# Patient Record
Sex: Male | Born: 1945 | Race: White | Hispanic: No | Marital: Married | Smoking: Never smoker
Health system: Southern US, Community
[De-identification: ages and names within clinical notes are randomized; demographics above are authoritative.]

## PROBLEM LIST (undated history)

## (undated) DIAGNOSIS — J309 Allergic rhinitis, unspecified: Secondary | ICD-10-CM

## (undated) DIAGNOSIS — E785 Hyperlipidemia, unspecified: Secondary | ICD-10-CM

## (undated) DIAGNOSIS — I739 Peripheral vascular disease, unspecified: Secondary | ICD-10-CM

## (undated) DIAGNOSIS — Z86010 Personal history of colonic polyps: Secondary | ICD-10-CM

## (undated) DIAGNOSIS — I868 Varicose veins of other specified sites: Secondary | ICD-10-CM

## (undated) DIAGNOSIS — K5730 Diverticulosis of large intestine without perforation or abscess without bleeding: Secondary | ICD-10-CM

## (undated) DIAGNOSIS — N400 Enlarged prostate without lower urinary tract symptoms: Secondary | ICD-10-CM

## (undated) DIAGNOSIS — Z5189 Encounter for other specified aftercare: Secondary | ICD-10-CM

## (undated) DIAGNOSIS — T7840XA Allergy, unspecified, initial encounter: Secondary | ICD-10-CM

## (undated) DIAGNOSIS — E039 Hypothyroidism, unspecified: Secondary | ICD-10-CM

## (undated) DIAGNOSIS — M19019 Primary osteoarthritis, unspecified shoulder: Secondary | ICD-10-CM

## (undated) HISTORY — PX: AXILLARY LYMPH NODE DISSECTION: SHX5229

## (undated) HISTORY — DX: Benign prostatic hyperplasia without lower urinary tract symptoms: N40.0

## (undated) HISTORY — DX: Hyperlipidemia, unspecified: E78.5

## (undated) HISTORY — PX: TRANSFORAMINAL LUMBAR INTERBODY FUSION (TLIF) WITH PEDICLE SCREW FIXATION 1 LEVEL: SHX6141

## (undated) HISTORY — DX: Encounter for other specified aftercare: Z51.89

## (undated) HISTORY — DX: Diverticulosis of large intestine without perforation or abscess without bleeding: K57.30

## (undated) HISTORY — DX: Allergy, unspecified, initial encounter: T78.40XA

## (undated) HISTORY — DX: Personal history of colonic polyps: Z86.010

## (undated) HISTORY — DX: Hypothyroidism, unspecified: E03.9

## (undated) HISTORY — DX: Primary osteoarthritis, unspecified shoulder: M19.019

## (undated) HISTORY — DX: Varicose veins of other specified sites: I86.8

## (undated) HISTORY — PX: VASECTOMY: SHX75

## (undated) HISTORY — PX: COLONOSCOPY: SHX174

## (undated) HISTORY — PX: VARICOSE VEIN SURGERY: SHX832

## (undated) HISTORY — DX: Allergic rhinitis, unspecified: J30.9

---

## 2004-12-12 ENCOUNTER — Ambulatory Visit: Payer: Self-pay | Admitting: Internal Medicine

## 2004-12-18 ENCOUNTER — Ambulatory Visit: Payer: Self-pay | Admitting: Internal Medicine

## 2005-01-17 ENCOUNTER — Ambulatory Visit: Payer: Self-pay | Admitting: Internal Medicine

## 2005-06-28 ENCOUNTER — Ambulatory Visit: Payer: Self-pay | Admitting: Internal Medicine

## 2005-07-11 ENCOUNTER — Ambulatory Visit: Payer: Self-pay | Admitting: Internal Medicine

## 2006-05-12 ENCOUNTER — Ambulatory Visit: Payer: Self-pay | Admitting: Internal Medicine

## 2006-05-16 ENCOUNTER — Ambulatory Visit: Payer: Self-pay | Admitting: Internal Medicine

## 2008-06-30 ENCOUNTER — Ambulatory Visit: Payer: Self-pay | Admitting: Internal Medicine

## 2008-06-30 LAB — CONVERTED CEMR LAB
Albumin: 4.2 g/dL (ref 3.5–5.2)
Alkaline Phosphatase: 51 units/L (ref 39–117)
BUN: 17 mg/dL (ref 6–23)
Bilirubin Urine: NEGATIVE
Calcium: 9 mg/dL (ref 8.4–10.5)
Direct LDL: 130.2 mg/dL
Eosinophils Absolute: 0.1 10*3/uL (ref 0.0–0.7)
Eosinophils Relative: 3 % (ref 0.0–5.0)
GFR calc Af Amer: 111 mL/min
GFR calc non Af Amer: 91 mL/min
Glucose, Bld: 102 mg/dL — ABNORMAL HIGH (ref 70–99)
HCT: 46.3 % (ref 39.0–52.0)
HDL: 31.7 mg/dL — ABNORMAL LOW (ref >39.0)
Hemoglobin, Urine: NEGATIVE
Hemoglobin: 16 g/dL (ref 13.0–17.0)
MCV: 92.4 fL (ref 78.0–100.0)
Monocytes Absolute: 0.5 10*3/uL (ref 0.1–1.0)
Monocytes Relative: 10.9 % (ref 3.0–12.0)
Neutro Abs: 3.2 10*3/uL (ref 1.4–7.7)
Nitrite: NEGATIVE
PSA: 0.46 ng/mL (ref 0.10–4.00)
Platelets: 168 10*3/uL (ref 150–400)
Potassium: 4.2 meq/L (ref 3.5–5.1)
RDW: 12.3 % (ref 11.5–14.6)
TSH: 5.74 microintl units/mL — ABNORMAL HIGH (ref 0.35–5.50)
Total Protein, Urine: NEGATIVE mg/dL
Total Protein: 6.7 g/dL (ref 6.0–8.3)
Triglycerides: 101 mg/dL (ref 0–149)
Urine Glucose: NEGATIVE mg/dL
WBC: 5 10*3/uL (ref 4.5–10.5)
pH: 5.5 (ref 5.0–8.0)

## 2008-07-06 ENCOUNTER — Encounter (INDEPENDENT_AMBULATORY_CARE_PROVIDER_SITE_OTHER): Payer: Self-pay | Admitting: *Deleted

## 2008-07-06 ENCOUNTER — Ambulatory Visit: Payer: Self-pay | Admitting: Internal Medicine

## 2008-07-06 DIAGNOSIS — Z860100 Personal history of colon polyps, unspecified: Secondary | ICD-10-CM | POA: Insufficient documentation

## 2008-07-06 DIAGNOSIS — J309 Allergic rhinitis, unspecified: Secondary | ICD-10-CM

## 2008-07-06 DIAGNOSIS — E785 Hyperlipidemia, unspecified: Secondary | ICD-10-CM | POA: Insufficient documentation

## 2008-07-06 DIAGNOSIS — K5730 Diverticulosis of large intestine without perforation or abscess without bleeding: Secondary | ICD-10-CM

## 2008-07-06 DIAGNOSIS — M19019 Primary osteoarthritis, unspecified shoulder: Secondary | ICD-10-CM | POA: Insufficient documentation

## 2008-07-06 DIAGNOSIS — E039 Hypothyroidism, unspecified: Secondary | ICD-10-CM

## 2008-07-06 DIAGNOSIS — N400 Enlarged prostate without lower urinary tract symptoms: Secondary | ICD-10-CM

## 2008-07-06 HISTORY — DX: Diverticulosis of large intestine without perforation or abscess without bleeding: K57.30

## 2008-07-06 HISTORY — DX: Primary osteoarthritis, unspecified shoulder: M19.019

## 2008-07-06 HISTORY — DX: Benign prostatic hyperplasia without lower urinary tract symptoms: N40.0

## 2008-07-06 HISTORY — DX: Personal history of colonic polyps: Z86.010

## 2008-07-06 HISTORY — DX: Hyperlipidemia, unspecified: E78.5

## 2008-07-06 HISTORY — DX: Hypothyroidism, unspecified: E03.9

## 2008-07-06 HISTORY — DX: Allergic rhinitis, unspecified: J30.9

## 2008-07-06 HISTORY — DX: Personal history of colon polyps, unspecified: Z86.0100

## 2008-08-08 ENCOUNTER — Telehealth (INDEPENDENT_AMBULATORY_CARE_PROVIDER_SITE_OTHER): Payer: Self-pay | Admitting: *Deleted

## 2008-10-21 HISTORY — PX: OTHER SURGICAL HISTORY: SHX169

## 2008-10-28 ENCOUNTER — Ambulatory Visit (HOSPITAL_COMMUNITY): Admission: RE | Admit: 2008-10-28 | Discharge: 2008-10-29 | Payer: Self-pay | Admitting: Orthopedic Surgery

## 2008-12-21 ENCOUNTER — Telehealth (INDEPENDENT_AMBULATORY_CARE_PROVIDER_SITE_OTHER): Payer: Self-pay | Admitting: *Deleted

## 2008-12-21 DIAGNOSIS — I868 Varicose veins of other specified sites: Secondary | ICD-10-CM

## 2008-12-21 HISTORY — DX: Varicose veins of other specified sites: I86.8

## 2009-08-01 ENCOUNTER — Encounter: Payer: Self-pay | Admitting: Internal Medicine

## 2009-09-25 ENCOUNTER — Ambulatory Visit: Payer: Self-pay | Admitting: Internal Medicine

## 2009-09-25 LAB — CONVERTED CEMR LAB
ALT: 23 units/L (ref 0–53)
BUN: 17 mg/dL (ref 6–23)
Bilirubin Urine: NEGATIVE
CO2: 30 meq/L (ref 19–32)
Chloride: 106 meq/L (ref 96–112)
Cholesterol: 213 mg/dL — ABNORMAL HIGH (ref 0–200)
Creatinine, Ser: 0.9 mg/dL (ref 0.4–1.5)
Eosinophils Absolute: 0.2 10*3/uL (ref 0.0–0.7)
Eosinophils Relative: 3.8 % (ref 0.0–5.0)
Glucose, Bld: 99 mg/dL (ref 70–99)
HCT: 46.7 % (ref 39.0–52.0)
Hemoglobin, Urine: NEGATIVE
Leukocytes, UA: NEGATIVE
Lymphs Abs: 1.1 10*3/uL (ref 0.7–4.0)
MCHC: 34 g/dL (ref 30.0–36.0)
MCV: 93.6 fL (ref 78.0–100.0)
Monocytes Absolute: 0.5 10*3/uL (ref 0.1–1.0)
Neutrophils Relative %: 66.5 % (ref 43.0–77.0)
Nitrite: NEGATIVE
PSA: 0.48 ng/mL (ref 0.10–4.00)
Platelets: 172 10*3/uL (ref 150.0–400.0)
Potassium: 4.7 meq/L (ref 3.5–5.1)
TSH: 4.26 microintl units/mL (ref 0.35–5.50)
Total Bilirubin: 0.9 mg/dL (ref 0.3–1.2)
Total Protein: 7 g/dL (ref 6.0–8.3)
WBC: 5.5 10*3/uL (ref 4.5–10.5)
pH: 5.5 (ref 5.0–8.0)

## 2009-10-02 ENCOUNTER — Ambulatory Visit: Payer: Self-pay | Admitting: Internal Medicine

## 2009-11-28 ENCOUNTER — Ambulatory Visit: Payer: Self-pay | Admitting: Internal Medicine

## 2009-11-28 LAB — CONVERTED CEMR LAB
ALT: 22 units/L (ref 0–53)
Cholesterol: 149 mg/dL (ref 0–200)
Total Protein: 6.8 g/dL (ref 6.0–8.3)
Triglycerides: 44 mg/dL (ref 0.0–149.0)

## 2010-01-28 IMAGING — CR DG SHOULDER 1V*R*
1 series · 1 of 1 positions shown · non-contrast
Comparison: None

CLINICAL DATA: right shoulder total arthroplasty

RIGHT SHOULDER - 1 VIEW

[view not recorded]
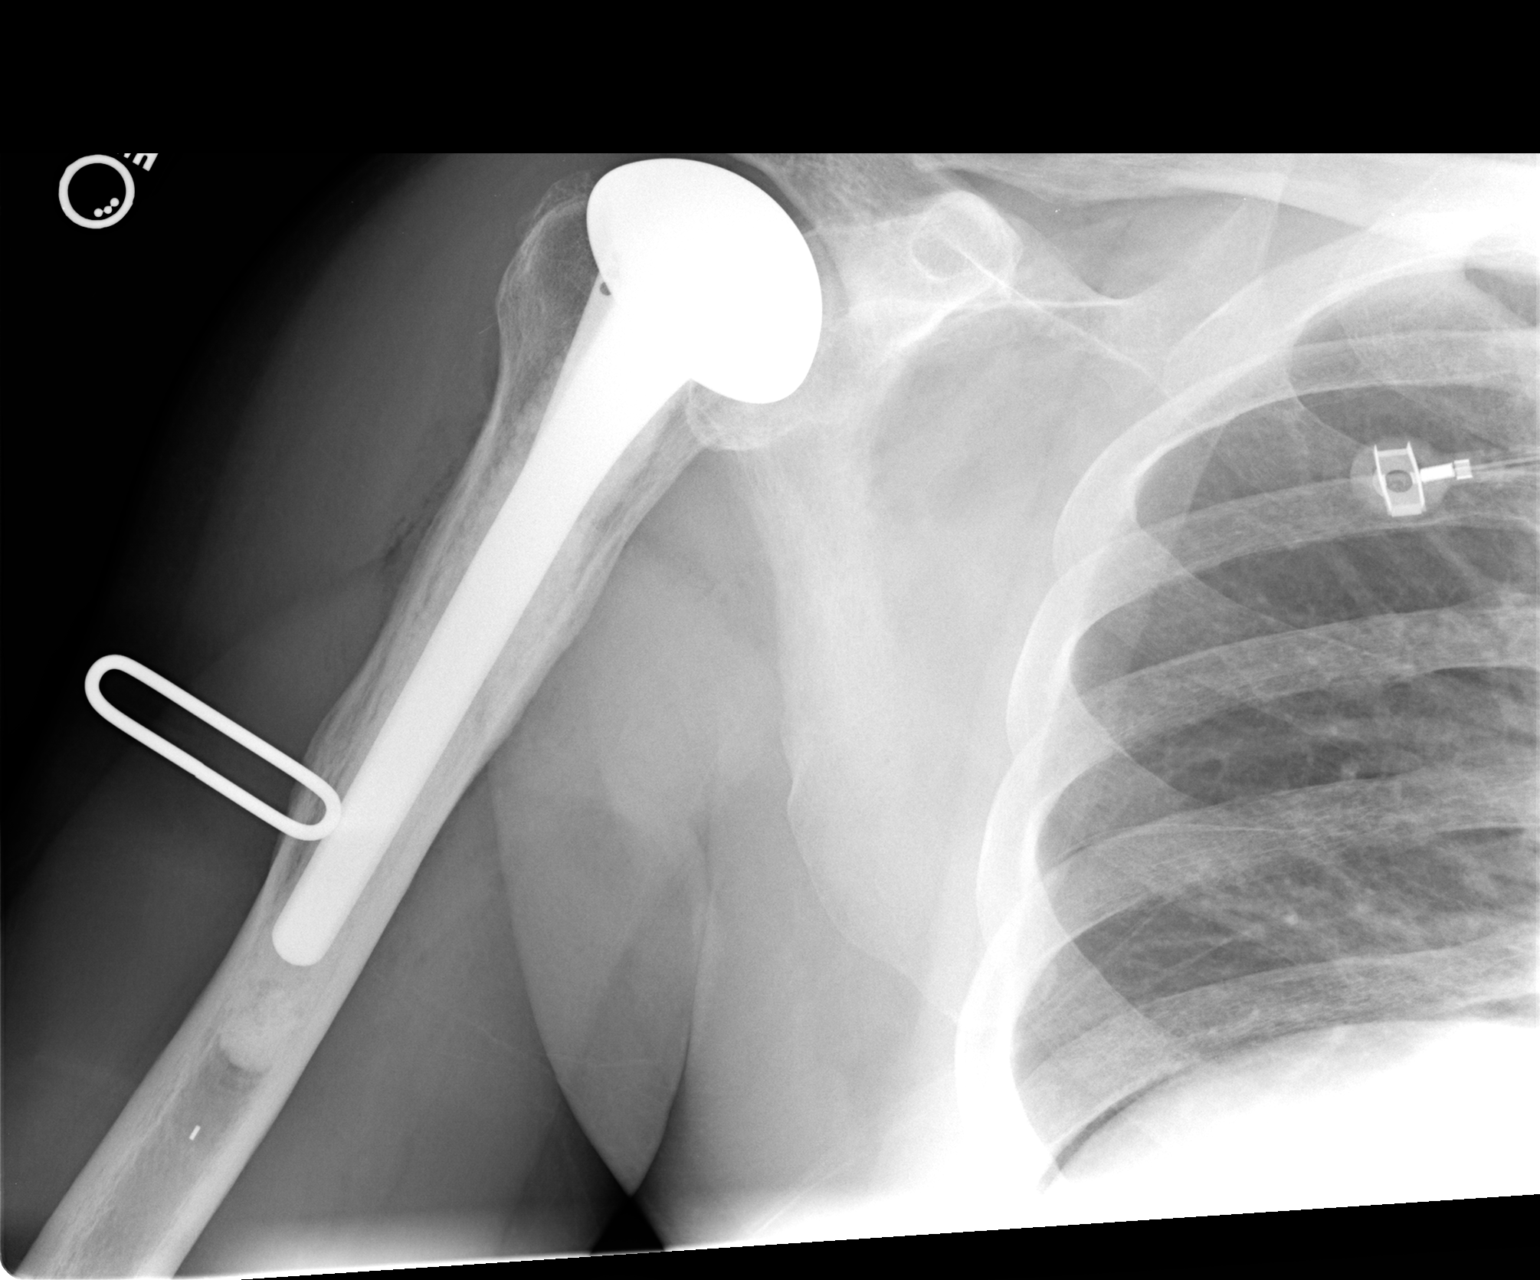

[1 of 1 positions shown; findings below may reference images not displayed]

FINDINGS: A single portable view provided.  There is a right
proximal humeral prosthesis in anatomic alignment.
IMPRESSION: Right humeral prosthesis in anatomic alignment.

## 2010-05-14 ENCOUNTER — Encounter: Payer: Self-pay | Admitting: Internal Medicine

## 2010-06-05 ENCOUNTER — Encounter (INDEPENDENT_AMBULATORY_CARE_PROVIDER_SITE_OTHER): Payer: Self-pay | Admitting: *Deleted

## 2010-07-04 ENCOUNTER — Encounter (INDEPENDENT_AMBULATORY_CARE_PROVIDER_SITE_OTHER): Payer: Self-pay | Admitting: *Deleted

## 2010-07-05 ENCOUNTER — Ambulatory Visit: Payer: Self-pay | Admitting: Internal Medicine

## 2010-07-18 ENCOUNTER — Ambulatory Visit: Payer: Self-pay | Admitting: Internal Medicine

## 2010-07-24 ENCOUNTER — Encounter: Payer: Self-pay | Admitting: Internal Medicine

## 2010-11-20 NOTE — Procedures (Signed)
Summary: Colonoscopy  Patient: [name] Note: All result statuses are Final unless otherwise noted.  Tests: (1) Colonoscopy (COL)   COL Colonoscopy           DONE     [facility]     [address]     [place], [place]  [address]           COLONOSCOPY PROCEDURE REPORT           PATIENT:  Kevin Stein, Kevin Stein  MR#:  [id number]     BIRTHDATE:  1945-11-06, 64 yrs. old  GENDER:  male     ENDOSCOPIST:  Wilhemina Bonito. Eda Keys, MD     REF. BY:  Surveillance Program Recall     PROCEDURE DATE:  07/18/2010     PROCEDURE:  Colonoscopy with snare polypectomy x 1     ASA CLASS:  Class II     INDICATIONS:  history of pre-cancerous (adenomatous) colon polyps,     surveillance and high-risk screening ; INDEX 2003 w/ TA; f/u 2006           MEDICATIONS:   Fentanyl 100 mcg IV, Versed 10 mg IV, Benadryl 12.5     mg IV           DESCRIPTION OF PROCEDURE:   After the risks benefits and     alternatives of the procedure were thoroughly explained, informed     consent was obtained.  Digital rectal exam was performed and     revealed no abnormalities.   The LB CF-[id number] endoscope     was introduced through the anus and advanced to the cecum, which     was identified by both the appendix and ileocecal valve, without     limitations.Time to cecum = 7:13 min.  The quality of the prep was     excellent, using MoviPrep.  The instrument was then slowly     withdrawn (time = 12:01 min) as the colon was fully examined.     <<PROCEDUREIMAGES>>           FINDINGS:  A diminutive polyp was found in the ascending colon.     Polyp was snared without cautery. Retrieval was successful.     Moderate diverticulosis was found in the sigmoid colon.     Retroflexed views in the rectum revealed no abnormalities.    The     scope was then withdrawn from the patient and the procedure     completed.           COMPLICATIONS:  None           ENDOSCOPIC IMPRESSION:     1) Diminutive polyp in the ascending  colon - removed     2) Moderate diverticulosis in the sigmoid colon           RECOMMENDATIONS:     1) Follow up colonoscopy in 5 years           ______________________________     Wilhemina Bonito. Eda Keys, MD           CC:  Corwin Levins, MD; The Patient           n.     eSIGNED:   Wilhemina Bonito. Eda Keys at 07/18/2010 11:35 AM           Pierre Bali, [id number]  Note: An exclamation mark (!) indicates a result that was not dispersed into the flowsheet. Document Creation Date: 07/18/2010 11:36 AM _______________________________________________________________________  (  1) Order result status: Final Collection or observation date-time: 07/18/2010 11:28 Requested date-time:  Receipt date-time:  Reported date-time:  Referring Physician:   Ordering Physician: Fransico Setters [phone]) Specimen Source:  Source: Launa Grill Order Number: [id number] Lab site:   Appended Document: Colonoscopy     Procedures Next Due Date:    Colonoscopy: 06/2015

## 2010-11-20 NOTE — Letter (Signed)
Summary: Colonoscopy Letter  [facility]  [address] [place], [place] [address]   Phone: [phone]  Fax: [phone]      May 14, 2010 MRN: [id number]   Kevin Stein [address] [place], [place]  [address]   Dear Mr. COMMONS,   According to your medical record, it is time for you to schedule a Colonoscopy. The American Cancer Society recommends this procedure as a method to detect early colon cancer. Patients with a family history of colon cancer, or a personal history of colon polyps or inflammatory bowel disease are at increased risk.  This letter has been generated based on the recommendations made at the time of your procedure. If you feel that in your particular situation this may no longer apply, please contact our office.  Please call our office at [phone] to schedule this appointment or to update your records at your earliest convenience.  Thank you for cooperating with [place] to provide you with the very best care possible.   Sincerely,  Wilhemina Bonito. Marina Goodell, M.D.  [facility] Gastroenterology Division [phone]

## 2010-11-20 NOTE — Letter (Signed)
Summary: Patient Notice- Polyp Results  [facility]  [address] [place], [place] [address]   Phone: [phone]  Fax: [phone]        July 24, 2010 MRN: [id number]    Kevin Stein [address] [place], [place]  [address]    Dear Kevin Stein,  I am pleased to inform you that the colon polyp(s) removed during your recent colonoscopy was (were) found to be benign (no cancer detected) upon pathologic examination.  I recommend you have a repeat colonoscopy examination in 5 years to look for recurrent polyps, as having colon polyps increases your risk for having recurrent polyps or even colon cancer in the future.  Should you develop new or worsening symptoms of abdominal pain, bowel habit changes or bleeding from the rectum or bowels, please schedule an evaluation with either your primary care physician or with me.  Additional information/recommendations:  __ No further action with gastroenterology is needed at this time. Please      follow-up with your primary care physician for your other healthcare      needs.  _ Please call us if you are having persistent problems or have questions about your condition that have not been fully answered at this time.  Sincerely,  Hilarie Fredrickson MD  This letter has been electronically signed by your physician.  Appended Document: Patient Notice- Polyp Results letter mailed

## 2010-11-20 NOTE — Letter (Signed)
Summary: Previsit letter  [facility]  [address] [place], [place] [address]   Phone: [phone]  Fax: [phone]       06/05/2010 MRN: [id number]  [facility] [address] [place], [place]  [address]  Dear Kevin Stein,  Welcome to the Gastroenterology Division at [facility].    You are scheduled to see a nurse for your pre-procedure visit on 07-05-10 at 8:30a.m. on the 3rd floor at [facility], [address]  We ask that you try to arrive at our office 15 minutes prior to your appointment time to allow for check-in.  Your nurse visit will consist of discussing your medical and surgical history, your immediate family medical history, and your medications.    Please bring a complete list of all your medications or, if you prefer, bring the medication bottles and we will list them.  We will need to be aware of both prescribed and over the counter drugs.  We will need to know exact dosage information as well.  If you are on blood thinners (Coumadin, Plavix, Aggrenox, Ticlid, etc.) please call our office today/prior to your appointment, as we need to consult with your physician about holding your medication.   Please be prepared to read and sign documents such as consent forms, a financial agreement, and acknowledgement forms.  If necessary, and with your consent, a friend or relative is welcome to sit-in on the nurse visit with you.  Please bring your insurance card so that we may make a copy of it.  If your insurance requires a referral to see a specialist, please bring your referral form from your primary care physician.  No co-pay is required for this nurse visit.     If you cannot keep your appointment, please call [phone] to cancel or reschedule prior to your appointment date.  This allows [place] the opportunity to schedule an appointment for another patient in need of care.    Thank you for choosing [facility] for your medical  needs.  We appreciate the opportunity to care for you.  Please visit [place] at our website  to learn more about our practice.                     Sincerely.                                                                                                                   The Gastroenterology Division

## 2010-11-20 NOTE — Miscellaneous (Signed)
Summary: recall colon--ch.  Clinical Lists Changes  Medications: Added new medication of MOVIPREP 100 GM  SOLR (PEG-KCL-NACL-NASULF-NA ASC-C) As directed - Signed Rx of MOVIPREP 100 GM  SOLR (PEG-KCL-NACL-NASULF-NA ASC-C) As directed;  #1 x 0;  Signed;  Entered by: Clide Cliff RN;  Authorized by: Hilarie Fredrickson MD;  Method used: [facility] [address] [phone]*, [address], [place], [place], [place]  [address], Ph: [phone] or [phone], Fax: [phone] Observations: Added new observation of ALLERGY REV: Done (07/05/2010 8:12)    Prescriptions: MOVIPREP 100 GM  SOLR (PEG-KCL-NACL-NASULF-NA ASC-C) As directed  #1 x 0   Entered by:   Clide Cliff RN   Authorized by:   Hilarie Fredrickson MD   Signed by:   Clide Cliff RN on 07/05/2010   Method used:   [facility]  [address] [phone]* (retail)       [address]       [place], [place]  [address]       Ph: [phone] or [phone]       Fax: [phone]   RxID:   [id number]

## 2010-11-20 NOTE — Letter (Signed)
Summary: [facility] Instructions  [facility]  [address] [place], [place] [address]   Phone: [phone]  Fax: [phone]       JEARLD HEMP    Mar 11, 1946    MRN: [id number]        Procedure Day Dorna Bloom:  [facility]  07/18/10     Arrival Time:  9:00AM      Procedure Time:  10:00AM     Location of Procedure:                    _ X_  [facility] (4th Floor)                       PREPARATION FOR COLONOSCOPY WITH MOVIPREP   Starting 5 days prior to your procedure 07/13/10 do not eat nuts, seeds, popcorn, corn, beans, peas,  salads, or any raw vegetables.  Do not take any fiber supplements (e.g. Metamucil, Citrucel, and Benefiber).  THE DAY BEFORE YOUR PROCEDURE         DATE: 07/17/10  DAY: TUESDAY  1.  Drink clear liquids the entire day-NO SOLID FOOD  2.  Do not drink anything colored red or purple.  Avoid juices with pulp.  No orange juice.  3.  Drink at least 64 oz. (8 glasses) of fluid/clear liquids during the day to prevent dehydration and help the prep work efficiently.  CLEAR LIQUIDS INCLUDE: Water Jello Ice Popsicles Tea (sugar ok, no milk/cream) Powdered fruit flavored drinks Coffee (sugar ok, no milk/cream) Gatorade Juice: apple, white grape, white cranberry  Lemonade Clear bullion, consomm, broth Carbonated beverages (any kind) Strained chicken noodle soup Hard Candy                             4.  In the morning, mix first dose of MoviPrep solution:    Empty 1 Pouch A and 1 Pouch B into the disposable container    Add lukewarm drinking water to the top line of the container. Mix to dissolve    Refrigerate (mixed solution should be used within 24 hrs)  5.  Begin drinking the prep at 5:00 p.m. The MoviPrep container is divided by 4 marks.   Every 15 minutes drink the solution down to the next mark (approximately 8 oz) until the full liter is complete.   6.  Follow completed prep with 16 oz of clear liquid of your choice  (Nothing red or purple).  Continue to drink clear liquids until bedtime.  7.  Before going to bed, mix second dose of MoviPrep solution:    Empty 1 Pouch A and 1 Pouch B into the disposable container    Add lukewarm drinking water to the top line of the container. Mix to dissolve    Refrigerate  THE DAY OF YOUR PROCEDURE      DATE: 07/18/10  DAY: WEDNESDAY  Beginning at 5:00AM (5 hours before procedure):         1. Every 15 minutes, drink the solution down to the next mark (approx 8 oz) until the full liter is complete.  2. Follow completed prep with 16 oz. of clear liquid of your choice.    3. You may drink clear liquids until 8:00AM (2 HOURS BEFORE PROCEDURE).   MEDICATION INSTRUCTIONS  Unless otherwise instructed, you should take regular prescription medications with a small sip of water   as early as possible the morning  of your procedure.         OTHER INSTRUCTIONS  You will need a responsible adult at least 63 years of age to accompany you and drive you home.   This person must remain in the waiting room during your procedure.  Wear loose fitting clothing that is easily removed.  Leave jewelry and other valuables at home.  However, you may wish to bring a book to read or  an iPod/MP3 player to listen to music as you wait for your procedure to start.  Remove all body piercing jewelry and leave at home.  Total time from sign-in until discharge is approximately 2-3 hours.  You should go home directly after your procedure and rest.  You can resume normal activities the  day after your procedure.  The day of your procedure you should not:   Drive   Make legal decisions   Operate machinery   Drink alcohol   Return to work  You will receive specific instructions about eating, activities and medications before you leave.    The above instructions have been reviewed and explained to me by   Clide Cliff, RN______________________    I fully understand  and can verbalize these instructions _____________________________ Date _________

## 2010-12-20 ENCOUNTER — Other Ambulatory Visit: Payer: Managed Care, Other (non HMO)

## 2010-12-20 ENCOUNTER — Other Ambulatory Visit: Payer: Self-pay | Admitting: Internal Medicine

## 2010-12-20 ENCOUNTER — Encounter (INDEPENDENT_AMBULATORY_CARE_PROVIDER_SITE_OTHER): Payer: Self-pay | Admitting: *Deleted

## 2010-12-20 DIAGNOSIS — Z0000 Encounter for general adult medical examination without abnormal findings: Secondary | ICD-10-CM

## 2010-12-20 DIAGNOSIS — Z1289 Encounter for screening for malignant neoplasm of other sites: Secondary | ICD-10-CM

## 2010-12-20 LAB — URINALYSIS
Ketones, ur: NEGATIVE
Specific Gravity, Urine: 1.025 (ref 1.000–1.030)
Urine Glucose: NEGATIVE
Urobilinogen, UA: 0.2 (ref 0.0–1.0)
pH: 6 (ref 5.0–8.0)

## 2010-12-20 LAB — HEPATIC FUNCTION PANEL
Bilirubin, Direct: 0 mg/dL (ref 0.0–0.3)
Total Bilirubin: 0.6 mg/dL (ref 0.3–1.2)
Total Protein: 6.2 g/dL (ref 6.0–8.3)

## 2010-12-20 LAB — CBC WITH DIFFERENTIAL/PLATELET
Basophils Relative: 0.3 % (ref 0.0–3.0)
Eosinophils Absolute: 0.1 10*3/uL (ref 0.0–0.7)
MCHC: 33.6 g/dL (ref 30.0–36.0)
MCV: 93.1 fl (ref 78.0–100.0)
Monocytes Absolute: 0.5 10*3/uL (ref 0.1–1.0)
Neutrophils Relative %: 68.1 % (ref 43.0–77.0)
Platelets: 172 10*3/uL (ref 150.0–400.0)
RDW: 13 % (ref 11.5–14.6)

## 2010-12-20 LAB — BASIC METABOLIC PANEL
BUN: 19 mg/dL (ref 6–23)
CO2: 28 mEq/L (ref 19–32)
Chloride: 105 mEq/L (ref 96–112)
Creatinine, Ser: 0.8 mg/dL (ref 0.4–1.5)
Glucose, Bld: 95 mg/dL (ref 70–99)

## 2010-12-20 LAB — LIPID PANEL
Cholesterol: 164 mg/dL (ref 0–200)
LDL Cholesterol: 102 mg/dL — ABNORMAL HIGH (ref 0–99)
Triglycerides: 106 mg/dL (ref 0.0–149.0)

## 2010-12-21 ENCOUNTER — Encounter: Payer: Self-pay | Admitting: Internal Medicine

## 2010-12-21 ENCOUNTER — Encounter (INDEPENDENT_AMBULATORY_CARE_PROVIDER_SITE_OTHER): Payer: Managed Care, Other (non HMO) | Admitting: Internal Medicine

## 2010-12-21 DIAGNOSIS — Z0000 Encounter for general adult medical examination without abnormal findings: Secondary | ICD-10-CM

## 2010-12-27 NOTE — Assessment & Plan Note (Signed)
Summary: PHYSICAL / NWS  #   Vital Signs:  Patient profile:   65 year old male Height:      72 inches Weight:      223 pounds BMI:     30.35 O2 Sat:      95 % on Room air Temp:     98 degrees F oral Pulse rate:   69 / minute BP sitting:   138 / 82  (left arm) Cuff size:   large  Vitals Entered By: Zella Ball Ewing CMA (AAMA) (December 21, 2010 10:27 AM)  O2 Flow:  Room air  CC: Adult Physical/RE   CC:  Adult Physical/RE.  History of Present Illness: here for wellness and f/u;  overall doin g ok except for ongoing fatigue and recurrent sleep disruption with recurring right shoulder pain, and occas nocturia;  overall o/w doing ok;  Pt denies CP, worsening sob, doe, wheezing, orthopnea, pnd, worsening LE edema, palps, dizziness or syncope  Pt denies new neuro symptoms such as headache, facial or extremity weakness  Pt denies polydipsia, polyuria Overall good compliance with meds, trying to follow low chol diet, wt stable, and tries to walk the dogs 3 mile per day on the wkends, plans to start doing more during the week.  Has some fatigue but no daytime somnolene.  No fever, wt loss, night sweats, loss of appetite or other constitutional symptoms  Overall good compliance with meds, and good tolerability.  Denies worsening depressive symptoms, suicidal ideation, or panic.   Pt states good ability with ADL's, low fall risk, home safety reviewed and adequate, no significant change in hearing or vision, trying to follow lower chol diet, and occasionally active only with regular excercise.   Preventive Screening-Counseling & Management      Drug Use:  no.    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 Take 1 By Mouth Qd 2)  Levothyroxine Sodium 25 Mcg Tabs (Levothyroxine Sodium) .Marland Kitchen.. 1 By Mouth Once Daily 3)  Adult Aspirin Ec Low Strength 81 Mg Tbec (Aspirin) .Marland Kitchen.. 1 By Mouth Once Daily 4)  Fish Oil 1200 Mg Caps (Omega-3 Fatty Acids) .Marland Kitchen.. 1 By Mouth Once Daily 5)  Vitamin B-12 2500 Mcg Subl (Cyanocobalamin) .Marland Kitchen.. 1 By Mouth Once Daily 6)  Pravachol 40 Mg Tabs (Pravastatin Sodium) .Marland Kitchen.. 1po Once Daily  Current Medications (verified): 1)  Multivitamins  Tabs (Multiple Vitamin) .... Take 1 By Mouth Qd 2)  Levothyroxine Sodium 25 Mcg Tabs (Levothyroxine Sodium) .Marland Kitchen.. 1 By Mouth Once Daily 3)  Adult Aspirin Ec Low Strength 81 Mg Tbec (Aspirin) .Marland Kitchen.. 1 By Mouth Once Daily 4)  Fish Oil 1200 Mg Caps (Omega-3 Fatty Acids) .Marland Kitchen.. 1 By Mouth Once Daily 5)  Vitamin B-12 2500 Mcg Subl (Cyanocobalamin) .Marland Kitchen.. 1 By Mouth Once Daily 6)  Pravachol 40 Mg Tabs (Pravastatin Sodium) .Marland Kitchen.. 1po Once Daily 7)  Glucosamine-Chondroitin  Caps (Glucosamine-Chondroit-Vit C-Mn) .Marland Kitchen.. 1 By Mouth Once Daily 8)  Flax Seed Oil 1000 Mg Caps (Flaxseed (Linseed)) .Marland Kitchen.. 1 By Mouth Once Daily 9)  Tramadol Hcl 100 Mg Xr24h-Tab (Tramadol Hcl) .Marland KitchenMarland KitchenMarland Kitchen  1 By Mouth At Bedtime As Needed Pain  Allergies (verified): No Known Drug Allergies  Past History:  Past Medical History: Last updated: 07/06/2008 Hyperlipidemia Benign prostatic hypertrophy Diverticulosis, colon Colonic polyps, hx of - adenomatous 2003 Hypothyroidism Allergic rhinitis  Past Surgical History: Last updated: 10/02/2009 Vasectomy s/p left middle ear surgury s/p right shoulder surgury in jan 2010 - dr Ranell Patrick  Family History: Last updated: 07/06/2008 HTN DM testicular cancer  Social History: Last updated: 12/21/2010 Never Smoked Alcohol use-yes work - [employer]or[employer] Married 2 children Drug use-no  Risk  Factors: Smoking Status: never (07/06/2008)  Social History: Never Smoked Alcohol use-yes work - Surveyor, [employer] and [employer] Married 2 children Drug use-no Drug Use:  no  Review of Systems  The patient denies anorexia, fever, vision loss, decreased hearing, hoarseness, chest pain, syncope, dyspnea on exertion, peripheral edema, prolonged cough, headaches, hemoptysis, abdominal pain, melena, hematochezia, severe indigestion/heartburn, hematuria, muscle weakness, suspicious skin lesions, transient blindness, difficulty walking, depression, unusual weight change, abnormal bleeding, enlarged lymph nodes, and angioedema.         all otherwise negative per pt -    Physical Exam  General:  alert and overweight-appearing.   Head:  normocephalic and atraumatic.   Eyes:  vision grossly intact, pupils equal, and pupils round.   Ears:  R ear normal and L ear normal.   Nose:  no external deformity and no nasal discharge.   Mouth:  no gingival abnormalities and pharynx pink and moist.   Neck:  supple and no masses.   Lungs:  normal respiratory effort and normal breath sounds.   Heart:  normal rate and regular rhythm.   Abdomen:  soft, non-tender, and normal bowel sounds.   Msk:  no joint tenderness and no joint swelling. , right shoudler with decreased ROM Extremities:  no edema, no erythema  Neurologic:  cranial nerves II-XII intact and strength normal in all extremities.   Skin:  color normal and no rashes.   Psych:  not depressed appearing and slightly anxious.     Impression & Recommendations:  Problem # 1:  Preventive Health Care (ICD-V70.0) Overall doing well, age appropriate education and counseling updated, referral for preventive services and immunizations addressed, dietary counseling and smoking status adressed , most recent labs reviewed, ecg reviewed I have personally reviewed and have noted 1.The patient's medical and social history 2.Their use of alcohol, tobacco or illicit  drugs 3.Their current medications and supplements 4. Functional ability including ADL's, fall risk, home safety risk, hearing & visual impairment  5.Diet and physical activities 6.Evidence for depression or mood disorders The patients weight, height, BMI  have been recorded in the chart I have made referrals, counseling and provided education to the patient based review of the above  Orders: EKG w/ Interpretation ([address])  Problem # 2:  DEGENERATIVE JOINT DISEASE, RIGHT SHOULDER (ICD-715.91)  His updated medication list for this problem includes:    Adult Aspirin Ec Low Strength 81 Mg Tbec (Aspirin) .Marland Kitchen... 1 by mouth once daily    Tramadol Hcl 100 Mg Xr24h-tab (Tramadol hcl) .Marland Kitchen... 1 by mouth at bedtime as needed pain pt wants to avoid nsaid ;  for tramadol Er at bedtime as needed;  Discussed use of medications, application of heat or cold, and exercises.   Complete Medication List: 1)  Multivitamins Tabs (Multiple vitamin) .... Take 1 by mouth qd 2)  Levothyroxine Sodium 25 Mcg Tabs (Levothyroxine sodium) .Marland Kitchen.. 1 by mouth once daily 3)  Adult Aspirin  Ec Low Strength 81 Mg Tbec (Aspirin) .Marland Kitchen.. 1 by mouth once daily 4)  Fish Oil 1200 Mg Caps (Omega-3 fatty acids) .Marland Kitchen.. 1 by mouth once daily 5)  Vitamin B-12 2500 Mcg Subl (Cyanocobalamin) .Marland Kitchen.. 1 by mouth once daily 6)  Pravachol 40 Mg Tabs (Pravastatin sodium) .Marland Kitchen.. 1po once daily 7)  Glucosamine-chondroitin Caps (Glucosamine-chondroit-vit c-mn) .Marland Kitchen.. 1 by mouth once daily 8)  Flax Seed Oil 1000 Mg Caps (Flaxseed (linseed)) .Marland Kitchen.. 1 by mouth once daily 9)  Tramadol Hcl 100 Mg Xr24h-tab (Tramadol hcl) .Marland Kitchen.. 1 by mouth at bedtime as needed pain  Patient Instructions: 1)  Please take all new medications as prescribed - the pain medication at night 2)  Continue all previous medications as before this visit  3)  Please call if you feel you need the referral back to Dr Ranell Patrick 4)  Please schedule a follow-up appointment in 1 year, or sooner if  needed Prescriptions: LEVOTHYROXINE SODIUM 25 MCG TABS (LEVOTHYROXINE SODIUM) 1 by mouth once daily  #90 x 3   Entered and Authorized by:   Corwin Levins MD   Signed by:   Corwin Levins MD on 12/21/2010   Method used:   [facility]        [facility]  [address] [phone]* (retail)       [address]       [place], [place]  [address]       Ph: [phone] or [phone]       Fax: [phone]   RxID:   [id number] PRAVACHOL 40 MG TABS (PRAVASTATIN SODIUM) 1po once daily  #90 x 3   Entered and Authorized by:   Corwin Levins MD   Signed by:   Corwin Levins MD on 12/21/2010   Method used:   [facility]ically to        [facility] [address] [phone]* (retail)       [address]       [place], [place]  [address]       Ph: [phone] or [phone]       Fax: [phone]   RxID:   [id number] TRAMADOL HCL 100 MG XR24H-TAB (TRAMADOL HCL) 1 by mouth at bedtime as needed pain  #30 x 5   Entered and Authorized by:   Corwin Levins MD   Signed by:   Corwin Levins MD on 12/21/2010   Method used:   [facility]ronically to        [facility] [address] [phone]* (retail)       [address]       [place], [place]  [address]       Ph: [phone] or [phone]       Fax: [phone]   RxID:   [id number]    Orders Added: 1)  EKG w/ Interpretation [[address]] 2)  Est. Patient 40-64 years [phone]

## 2011-02-04 LAB — URINALYSIS, ROUTINE W REFLEX MICROSCOPIC
Bilirubin Urine: NEGATIVE
Hgb urine dipstick: NEGATIVE
Nitrite: NEGATIVE
Specific Gravity, Urine: 1.026 (ref 1.005–1.030)
pH: 7 (ref 5.0–8.0)

## 2011-02-04 LAB — TYPE AND SCREEN
ABO/RH(D): A POS
Antibody Screen: NEGATIVE

## 2011-02-04 LAB — CBC
HCT: 47.8 % (ref 39.0–52.0)
Hemoglobin: 13.6 g/dL (ref 13.0–17.0)
Hemoglobin: 16.1 g/dL (ref 13.0–17.0)
MCHC: 33.7 g/dL (ref 30.0–36.0)
MCV: 91.3 fL (ref 78.0–100.0)
RBC: 5.24 MIL/uL (ref 4.22–5.81)
RDW: 12.8 % (ref 11.5–15.5)

## 2011-02-04 LAB — BASIC METABOLIC PANEL
CO2: 25 mEq/L (ref 19–32)
Calcium: 8.4 mg/dL (ref 8.4–10.5)
Chloride: 105 mEq/L (ref 96–112)
Creatinine, Ser: 0.85 mg/dL (ref 0.4–1.5)
GFR calc Af Amer: >60 mL/min (ref >60)
GFR calc Af Amer: >60 mL/min (ref >60)
GFR calc non Af Amer: >60 mL/min (ref >60)
Glucose, Bld: 109 mg/dL — ABNORMAL HIGH (ref 70–99)
Potassium: 4.5 mEq/L (ref 3.5–5.1)
Sodium: 136 mEq/L (ref 135–145)
Sodium: 139 mEq/L (ref 135–145)

## 2011-02-04 LAB — DIFFERENTIAL
Basophils Relative: 0 % (ref 0–1)
Eosinophils Absolute: 0.1 10*3/uL (ref 0.0–0.7)
Monocytes Absolute: 0.4 10*3/uL (ref 0.1–1.0)
Monocytes Relative: 8 % (ref 3–12)

## 2011-03-05 NOTE — Op Note (Signed)
NAME:  Kevin Stein, Kevin Stein           ACCOUNT NO.:  [id number]   MEDICAL RECORD NO.:  [id number]          PATIENT TYPE:  OIB   LOCATION:  5021                         FACILITY:  [facility]   PHYSICIAN:  Almedia Balls. Ranell Patrick, M.D. DATE OF BIRTH:  24-Mar-1946   DATE OF PROCEDURE:  10/28/2008  DATE OF DISCHARGE:                               OPERATIVE REPORT   PREOPERATIVE DIAGNOSIS:  Right shoulder end-stage osteoarthritis.   POSTOPERATIVE DIAGNOSIS:  Right shoulder end-stage osteoarthritis.   PROCEDURE PERFORMED:  Right shoulder hemiarthroplasty using DePuy [employer]  Advantage System.   ATTENDING SURGEON:  Almedia Balls. Ranell Patrick, MD   ASSISTANT:  Donnie Coffin. Dixon, PA-C   General anesthesia was used plus interscalene block.   ESTIMATED BLOOD LOSS:  200 mL.   FLUID REPLACEMENT:  1200 mL of crystalloid.   URINE OUTPUT:  Not recorded.   INSTRUMENT COUNT:  Correct.   COMPLICATIONS:  None.   Preoperative antibiotics were given.   INDICATIONS:  The patient is a 65 year old male with worsening arthritis  in the right shoulder and pain as well as loss of function.  The patient  has failed an extensive period of conservative management and desires  operative treatment to restore function and eliminate pain.  Informed  consent was obtained.   DESCRIPTION OF THE PROCEDURE:  After an adequate level of anesthesia was  achieved, the patient was positioned in modified beach chair position.  The right shoulder was sterilely prepped and draped in the usual  fashion.  The patient's external rotation with elbow to the side is  about 20 degrees.  We performed a deltopectoral approach starting at the  coracoid process extending down towards the anterior humeral shaft,  dissection down through the subcutaneous tissue using Bovie, identified  the cephalic vein taken laterally with the deltoid, pectoralis was taken  medially, upper 0.5 cm peg was released off the humerus.  Conjoint  tendon was taken medially.  The  subscapularis was identified and taken  off the bicipital groove using a needle tip Bovie.  We then divided the  subscapularis free from the underlying capsule, and then placed #2  FiberWires in the modified W stitch into the free end of the  subscapularis.  Next, we went ahead and released the soft tissue off the  humerus laterally and progressively externally rotated.  We then went  ahead and made a humeral cut using the humeral neck resection guide with  the elbow at the patient's side and the forearm externally rotated about  10-15 degrees.  Once this cut was made, we went ahead and removed excess  osteophytes off the posteroinferior humerus.  We then went ahead and  checked the glenoid condition.  The glenoid cartilage was in good shape.  The labrum was intact, but was seemed to be somewhat torn.  We went and  left that alone for stability.  We did go ahead and tenotomized the  biceps and marked that at the appropriate length.  Next, we went ahead  and sequentially reamed up to a size 12 stem.  We actually tried for a  14 and pretty much got the reamer  down, but when we broached, we were  only able to broach up to the size 12.  Thus we decided we are going to  cement a 12 and our version was perfect at about 10-15 degrees of  retroversion  on our cut.  With the stem in place, selected our humeral  size for maximum coverage.  We used a 52 x 21 eccentric and dialed that  posterior superiorly, reduced the shoulder and we were happy with soft  tissue balancing and coverage.  I was able to translate the humerus 50%  of the posterior diameter of the glenoid and 50% inferiorly.  We then  removed the trial implants.  We thoroughly pulse irrigated, placed the  cement restricter distal and the cement implant into place using a DePuy  1 cement.  Once the cement was allowed to harden, we again checked  posterior aspect of shoulder for any loose bone or osteophytes, none  were encountered.  We  placed two #2 FiberWire sutures in the shaft prior  to cementing the stem.  We then repaired the subscap anatomically with  #2 FiberWire both in the rotator interval as well as the W stitch  sutures through bone and through tendon.  We had an anatomic repair of  the subscapularis.  It was not under significant tension until about 30  degrees of external rotation, and the patient's shoulder moved very well  passively.  We had impacted the real 52 x 21 prior to closure.  At this  point, we closed the deltopectoral interval with 0 Vicryl suture bearing  the cephalic vein and then we went ahead and closed subcutaneous tissues  with 2-0 Vicryl followed by 4-0 Monocryl for skin.  Sterile dressing was  applied.  The patient tolerated the surgery well.      Almedia Balls. Ranell Patrick, M.D.  Electronically Signed     SRN/MEDQ  D:  10/28/2008  T:  10/29/2008  Job:  [id number]

## 2011-05-27 ENCOUNTER — Other Ambulatory Visit: Payer: Self-pay

## 2011-05-27 MED ORDER — LEVOTHYROXINE SODIUM 25 MCG PO TABS: 25.0000 ug | ORAL_TABLET | Freq: Every day | ORAL | Status: DC

## 2011-05-27 MED ORDER — PRAVASTATIN SODIUM 40 MG PO TABS: 40.0000 mg | ORAL_TABLET | Freq: Every evening | ORAL | Status: DC

## 2012-03-23 ENCOUNTER — Telehealth: Payer: Self-pay

## 2012-03-23 DIAGNOSIS — Z0000 Encounter for general adult medical examination without abnormal findings: Secondary | ICD-10-CM

## 2012-03-23 NOTE — Telephone Encounter (Signed)
Put order in for physical labs. 

## 2012-06-02 ENCOUNTER — Other Ambulatory Visit (INDEPENDENT_AMBULATORY_CARE_PROVIDER_SITE_OTHER): Payer: Managed Care, Other (non HMO)

## 2012-06-02 DIAGNOSIS — Z0000 Encounter for general adult medical examination without abnormal findings: Secondary | ICD-10-CM

## 2012-06-02 LAB — CBC WITH DIFFERENTIAL/PLATELET
Basophils Relative: 0.4 % (ref 0.0–3.0)
Eosinophils Absolute: 0.2 10*3/uL (ref 0.0–0.7)
Hemoglobin: 15.3 g/dL (ref 13.0–17.0)
Lymphocytes Relative: 22.9 % (ref 12.0–46.0)
MCHC: 32.6 g/dL (ref 30.0–36.0)
Monocytes Relative: 11.2 % (ref 3.0–12.0)
Neutro Abs: 3.4 10*3/uL (ref 1.4–7.7)
RBC: 5.03 Mil/uL (ref 4.22–5.81)

## 2012-06-02 LAB — BASIC METABOLIC PANEL
BUN: 22 mg/dL (ref 6–23)
CO2: 27 mEq/L (ref 19–32)
Chloride: 104 mEq/L (ref 96–112)
Creatinine, Ser: 0.8 mg/dL (ref 0.4–1.5)

## 2012-06-02 LAB — URINALYSIS, ROUTINE W REFLEX MICROSCOPIC
Bilirubin Urine: NEGATIVE
Hgb urine dipstick: NEGATIVE
Leukocytes, UA: NEGATIVE
Nitrite: NEGATIVE

## 2012-06-02 LAB — HEPATIC FUNCTION PANEL
ALT: 21 U/L (ref 0–53)
Albumin: 4.2 g/dL (ref 3.5–5.2)
Total Protein: 6.6 g/dL (ref 6.0–8.3)

## 2012-06-02 LAB — LIPID PANEL
LDL Cholesterol: 109 mg/dL — ABNORMAL HIGH (ref 0–99)
Total CHOL/HDL Ratio: 4

## 2012-06-02 LAB — TSH: TSH: 4.55 u[IU]/mL (ref 0.35–5.50)

## 2012-06-09 ENCOUNTER — Encounter: Payer: Self-pay | Admitting: Internal Medicine

## 2012-06-09 ENCOUNTER — Ambulatory Visit (INDEPENDENT_AMBULATORY_CARE_PROVIDER_SITE_OTHER): Payer: Managed Care, Other (non HMO) | Admitting: Internal Medicine

## 2012-06-09 VITALS — BP 142/70 | HR 76 | Temp 97.4°F | Ht 72.0 in | Wt 219.2 lb

## 2012-06-09 DIAGNOSIS — Z0000 Encounter for general adult medical examination without abnormal findings: Secondary | ICD-10-CM | POA: Insufficient documentation

## 2012-06-09 MED ORDER — LEVOTHYROXINE SODIUM 25 MCG PO TABS: 25.0000 ug | ORAL_TABLET | Freq: Every day | ORAL | Status: DC

## 2012-06-09 MED ORDER — PRAVASTATIN SODIUM 40 MG PO TABS: 40.0000 mg | ORAL_TABLET | Freq: Every evening | ORAL | Status: DC

## 2012-06-09 NOTE — Patient Instructions (Addendum)
Continue all other medications as before Your refills were done today as requested Please call if you would like the referral to urology Please continue your efforts at being more active, low cholesterol diet, and weight control. Please return in 1 year for your yearly visit, or sooner if needed, with Lab testing done 3-5 days before

## 2012-06-09 NOTE — Progress Notes (Signed)
Subjective:    Patient ID: Kevin Stein, male    DOB: 06-19-1946, 66 y.o.   MRN: [id number]  HPI  Here for wellness and f/u;  Overall doing ok;  Pt denies CP, worsening SOB, DOE, wheezing, orthopnea, PND, worsening LE edema, palpitations, dizziness or syncope.  Pt denies neurological change such as new Headache, facial or extremity weakness.  Pt denies polydipsia, polyuria, or low sugar symptoms. Pt states overall good compliance with treatment and medications, good tolerability, and trying to follow lower cholesterol diet.  Pt denies worsening depressive symptoms, suicidal ideation or panic. No fever, wt loss, night sweats, loss of appetite, or other constitutional symptoms.  Pt states good ability with ADL's, low fall risk, home safety reviewed and adequate, no significant changes in hearing or vision, and occasionally active with exercise.  Does have sense of ongoing fatigue, but denies signficant hypersomnolence, though his wife does.  Does have to get up 1-3 times per night, depending on fluids he's had during the day which disrupts sleep. Past Medical History  Diagnosis Date  . HYPOTHYROIDISM 07/06/2008    Qualifier: Diagnosis of  By: Jonny Ruiz MD, Len Blalock   . HYPERLIPIDEMIA 07/06/2008    Qualifier: Diagnosis of  By: Jonny Ruiz MD, Len Blalock   . ALLERGIC RHINITIS 07/06/2008    Qualifier: Diagnosis of  By: Jonny Ruiz MD, Len Blalock   . 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 DIVERTICULOSIS, COLON 07/06/2008    Qualifier: Diagnosis of  By: Jonny Ruiz MD, Len Blalock   . DEGENERATIVE JOINT DISEASE, RIGHT SHOULDER 07/06/2008    Qualifier: Diagnosis of  By: Jonny Ruiz MD, Len Blalock   . COLONIC POLYPS, HX OF 07/06/2008    Qualifier: Diagnosis of  By: Jonny Ruiz MD, Len Blalock   . BENIGN PROSTATIC HYPERTROPHY 07/06/2008    Qualifier: Diagnosis of  By: Jonny Ruiz MD, Len Blalock    Past Surgical History  Procedure Date  . Vasectomy   . Left middle ear surgury   . Right shoulder surgury 2010    Dr Ranell Patrick     reports that he has never smoked. He has never used smokeless tobacco. He reports that he drinks alcohol. He reports that he does not use illicit drugs. family history includes Cancer in his father; Diabetes in his mother; and Hypertension in his mother. No Known Allergies Current Outpatient Prescriptions on File Prior to Visit  Medication Sig Dispense Refill  . DISCONTD: levothyroxine (LEVOTHROID) 25 MCG tablet Take 1 tablet (25 mcg total) by mouth daily.  90 tablet  2  . DISCONTD: pravastatin (PRAVACHOL) 40 MG tablet Take 1 tablet (40 mg total) by mouth every evening.  90 tablet  2   Review of Systems Review of Systems  Constitutional: Negative for diaphoresis, activity change, appetite change and unexpected weight change.  HENT: Negative for hearing loss, ear pain, facial swelling, mouth sores and neck stiffness.   Eyes: Negative for pain, redness and visual disturbance.  Respiratory: Negative for shortness of breath and wheezing.   Cardiovascular: Negative for chest pain and palpitations.  Gastrointestinal: Negative for diarrhea, blood in stool, abdominal distention and rectal pain.  Genitourinary: Negative for hematuria, flank pain and decreased urine volume.  Musculoskeletal: Negative for myalgias and joint swelling.  Skin: Negative for color change and wound.  Neurological: Negative for syncope and numbness.  Hematological: Negative for adenopathy.  Psychiatric/Behavioral: Negative for hallucinations, self-injury, decreased concentration and agitation.  Objective:   Physical Exam BP 142/70  Pulse 76  Temp 97.4 F (36.3 C) (Oral)  Ht 6' (1.829 m)  Wt 219 lb 4 oz (99.451 kg)  BMI 29.74 kg/m2  SpO2 96% Physical Exam  VS noted Constitutional: Pt is oriented to person, place, and time. Appears well-developed and well-nourished.  HENT:  Head: Normocephalic and atraumatic.  Right Ear: External ear normal.  Left Ear: External ear normal.  Nose: Nose normal.    Mouth/Throat: Oropharynx is clear and moist.  Eyes: Conjunctivae and EOM are normal. Pupils are equal, round, and reactive to light.  Neck: Normal range of motion. Neck supple. No JVD present. No tracheal deviation present.  Cardiovascular: Normal rate, regular rhythm, normal heart sounds and intact distal pulses.   Pulmonary/Chest: Effort normal and breath sounds normal.  Abdominal: Soft. Bowel sounds are normal. There is no tenderness.  Musculoskeletal: Normal range of motion. Exhibits no edema.  Lymphadenopathy:  Has no cervical adenopathy.  Neurological: Pt is alert and oriented to person, place, and time. Pt has normal reflexes. No cranial nerve deficit. Motor/sens/dtr intact Skin: Skin is warm and dry. No rash noted.  Psychiatric:  Has  normal mood and affect. Behavior is normal.     Assessment & Plan:

## 2013-02-14 ENCOUNTER — Encounter (HOSPITAL_COMMUNITY): Payer: Self-pay | Admitting: Nurse Practitioner

## 2013-02-14 ENCOUNTER — Emergency Department (HOSPITAL_COMMUNITY): Payer: Managed Care, Other (non HMO)

## 2013-02-14 ENCOUNTER — Inpatient Hospital Stay (HOSPITAL_COMMUNITY)
Admission: EM | Admit: 2013-02-14 | Discharge: 2013-02-17 | DRG: 690 | Disposition: A | Discharge status: Home / Self Care | Payer: Managed Care, Other (non HMO) | Attending: Internal Medicine | Admitting: Internal Medicine

## 2013-02-14 DIAGNOSIS — R6510 Systemic inflammatory response syndrome (SIRS) of non-infectious origin without acute organ dysfunction: Secondary | ICD-10-CM | POA: Diagnosis present

## 2013-02-14 DIAGNOSIS — E785 Hyperlipidemia, unspecified: Secondary | ICD-10-CM | POA: Diagnosis present

## 2013-02-14 DIAGNOSIS — I868 Varicose veins of other specified sites: Secondary | ICD-10-CM

## 2013-02-14 DIAGNOSIS — K5730 Diverticulosis of large intestine without perforation or abscess without bleeding: Secondary | ICD-10-CM

## 2013-02-14 DIAGNOSIS — E039 Hypothyroidism, unspecified: Secondary | ICD-10-CM

## 2013-02-14 DIAGNOSIS — N390 Urinary tract infection, site not specified: Secondary | ICD-10-CM | POA: Diagnosis present

## 2013-02-14 DIAGNOSIS — I959 Hypotension, unspecified: Secondary | ICD-10-CM | POA: Diagnosis present

## 2013-02-14 DIAGNOSIS — Q619 Cystic kidney disease, unspecified: Secondary | ICD-10-CM

## 2013-02-14 DIAGNOSIS — A419 Sepsis, unspecified organism: Secondary | ICD-10-CM

## 2013-02-14 DIAGNOSIS — R509 Fever, unspecified: Secondary | ICD-10-CM | POA: Diagnosis present

## 2013-02-14 DIAGNOSIS — J309 Allergic rhinitis, unspecified: Secondary | ICD-10-CM

## 2013-02-14 DIAGNOSIS — D696 Thrombocytopenia, unspecified: Secondary | ICD-10-CM | POA: Diagnosis present

## 2013-02-14 DIAGNOSIS — Z86010 Personal history of colonic polyps: Secondary | ICD-10-CM

## 2013-02-14 DIAGNOSIS — N400 Enlarged prostate without lower urinary tract symptoms: Secondary | ICD-10-CM | POA: Diagnosis present

## 2013-02-14 DIAGNOSIS — A498 Other bacterial infections of unspecified site: Secondary | ICD-10-CM | POA: Diagnosis present

## 2013-02-14 DIAGNOSIS — M19019 Primary osteoarthritis, unspecified shoulder: Secondary | ICD-10-CM | POA: Diagnosis present

## 2013-02-14 DIAGNOSIS — Z0000 Encounter for general adult medical examination without abnormal findings: Secondary | ICD-10-CM

## 2013-02-14 LAB — CBC WITH DIFFERENTIAL/PLATELET
Basophils Absolute: 0 10*3/uL (ref 0.0–0.1)
Basophils Relative: 0 % (ref 0–1)
Eosinophils Absolute: 0 10*3/uL (ref 0.0–0.7)
Eosinophils Relative: 0 % (ref 0–5)
HCT: 43.2 % (ref 39.0–52.0)
Hemoglobin: 15.3 g/dL (ref 13.0–17.0)
Lymphocytes Relative: 2 % — ABNORMAL LOW (ref 12–46)
Lymphs Abs: 0.3 10*3/uL — ABNORMAL LOW (ref 0.7–4.0)
MCH: 31 pg (ref 26.0–34.0)
MCHC: 35.4 g/dL (ref 30.0–36.0)
MCV: 87.6 fL (ref 78.0–100.0)
Monocytes Absolute: 0.4 10*3/uL (ref 0.1–1.0)
Monocytes Relative: 3 % (ref 3–12)
Neutro Abs: 13 10*3/uL — ABNORMAL HIGH (ref 1.7–7.7)
Neutrophils Relative %: 95 % — ABNORMAL HIGH (ref 43–77)
Platelets: 129 10*3/uL — ABNORMAL LOW (ref 150–400)
RBC: 4.93 MIL/uL (ref 4.22–5.81)
RDW: 12.9 % (ref 11.5–15.5)
WBC: 13.7 10*3/uL — ABNORMAL HIGH (ref 4.0–10.5)

## 2013-02-14 LAB — COMPREHENSIVE METABOLIC PANEL
ALT: 16 U/L (ref 0–53)
AST: 21 U/L (ref 0–37)
Albumin: 3.6 g/dL (ref 3.5–5.2)
Alkaline Phosphatase: 59 U/L (ref 39–117)
BUN: 26 mg/dL — ABNORMAL HIGH (ref 6–23)
CO2: 23 mEq/L (ref 19–32)
Calcium: 9.9 mg/dL (ref 8.4–10.5)
Chloride: 105 mEq/L (ref 96–112)
Creatinine, Ser: 0.91 mg/dL (ref 0.50–1.35)
GFR calc Af Amer: >90 mL/min (ref >90)
GFR calc non Af Amer: 88 mL/min — ABNORMAL LOW (ref >90)
Glucose, Bld: 195 mg/dL — ABNORMAL HIGH (ref 70–99)
Potassium: 3.7 mEq/L (ref 3.5–5.1)
Sodium: 138 mEq/L (ref 135–145)
Total Bilirubin: 0.6 mg/dL (ref 0.3–1.2)
Total Protein: 6.4 g/dL (ref 6.0–8.3)

## 2013-02-14 LAB — URINALYSIS, ROUTINE W REFLEX MICROSCOPIC
Bilirubin Urine: NEGATIVE
Glucose, UA: 100 mg/dL — AB
Ketones, ur: 15 mg/dL — AB
Nitrite: POSITIVE — AB
Protein, ur: 30 mg/dL — AB
Specific Gravity, Urine: 1.031 — ABNORMAL HIGH (ref 1.005–1.030)
Urobilinogen, UA: 0.2 mg/dL (ref 0.0–1.0)
pH: 5 (ref 5.0–8.0)

## 2013-02-14 LAB — URINE MICROSCOPIC-ADD ON

## 2013-02-14 LAB — CG4 I-STAT (LACTIC ACID): Lactic Acid, Venous: 0.94 mmol/L (ref 0.5–2.2)

## 2013-02-14 MED ORDER — DEXTROSE 5 % IV SOLN
1.0000 g | Freq: Once | INTRAVENOUS | Status: AC
  Administered 2013-02-14: 1 g via INTRAVENOUS
  Filled 2013-02-14: qty 10

## 2013-02-14 MED ORDER — SODIUM CHLORIDE 0.9 % IV BOLUS (SEPSIS)
1000.0000 mL | Freq: Once | INTRAVENOUS | Status: AC
  Administered 2013-02-14: 1000 mL via INTRAVENOUS

## 2013-02-14 MED ORDER — IBUPROFEN 200 MG PO TABS
600.0000 mg | ORAL_TABLET | Freq: Once | ORAL | Status: AC
  Administered 2013-02-14: 600 mg via ORAL
  Filled 2013-02-14: qty 3

## 2013-02-14 MED ORDER — SODIUM CHLORIDE 0.9 % IV BOLUS (SEPSIS)
1000.0000 mL | Freq: Once | INTRAVENOUS | Status: AC
  Administered 2013-02-15: 1000 mL via INTRAVENOUS

## 2013-02-14 NOTE — ED Notes (Addendum)
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 VSS. Reports highest fever PTA was 105.

## 2013-02-14 NOTE — ED Notes (Addendum)
Lab at [facility] for 2nd [facility] and lactic acid. 1st BC drawn with IV.

## 2013-02-14 NOTE — ED Provider Notes (Signed)
10:13 PM Notified by nursing pt in waiting room presented with fever. Initially BP ok but on most recent vitals 90/50s. Pt actually stating is feeling better than when initially came in though. UA consistent with UTI. Instructed that needs IV and next available room. Abx ordered.   Raeford Razor, MD 02/14/13 2215

## 2013-02-14 NOTE — ED Notes (Signed)
Pt reports he felt chills and fever this afternoon so he took some tylenol about 1 hour ago and came to er. Also had some nausea today. No other complaints

## 2013-02-14 NOTE — ED Notes (Signed)
No changes, VSS, Dr. Juleen China at [facility], admission explained.

## 2013-02-14 NOTE — ED Provider Notes (Signed)
History   67 year old male presented with generalized fatigue and fever. Gradual onset about a day ago. Significantly worse since this afternoon. Associated with urinary urgency and some mild nausea. Shaking chills. No vomiting. Patient denies any abdominal or back pain. No dysuria or hematuria. No vomiting or diarrhea. No chest pain or shortness of breath. No unusual leg pain or swelling. No rash. Patient has history of hyperlipidemia and hypothyroid.    CSN: [id number]  Arrival date & time 02/14/13  1844   First MD Initiated Contact with Patient 02/14/13 2212      Chief Complaint  Patient presents with  . Fever    (Consider location/radiation/quality/duration/timing/severity/associated sxs/prior treatment) HPI  Past Medical History  Diagnosis Date  . HYPOTHYROIDISM 07/06/2008    Qualifier: Diagnosis of  By: Jonny Ruiz MD, Len Blalock   . HYPERLIPIDEMIA 07/06/2008    Qualifier: Diagnosis of  By: Jonny Ruiz MD, Len Blalock   . ALLERGIC RHINITIS 07/06/2008    Qualifier: Diagnosis of  By: Jonny Ruiz MD, Len Blalock   . 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 DIVERTICULOSIS, COLON 07/06/2008    Qualifier: Diagnosis of  By: Jonny Ruiz MD, Len Blalock   . DEGENERATIVE JOINT DISEASE, RIGHT SHOULDER 07/06/2008    Qualifier: Diagnosis of  By: Jonny Ruiz MD, Len Blalock   . COLONIC POLYPS, HX OF 07/06/2008    Qualifier: Diagnosis of  By: Jonny Ruiz MD, Len Blalock   . BENIGN PROSTATIC HYPERTROPHY 07/06/2008    Qualifier: Diagnosis of  By: Jonny Ruiz MD, Len Blalock     Past Surgical History  Procedure Laterality Date  . Vasectomy    . Left middle ear surgury    . Right shoulder surgury  2010    Dr Ranell Patrick    Family History  Problem Relation Age of Onset  . Diabetes Mother   . Hypertension Mother   . Cancer Father     History  Substance Use Topics  . Smoking status: Never Smoker   . Smokeless tobacco: Never Used  . Alcohol Use: Yes      Review of Systems  All systems reviewed and negative, other  than as noted in HPI.   Allergies  Review of patient's allergies indicates no known allergies.  Home Medications   Current Outpatient Rx  Name  Route  Sig  Dispense  Refill  . acetaminophen (TYLENOL) 325 MG tablet   Oral   Take 975 mg by mouth every 6 (six) hours as needed for pain.         Marland Kitchen b complex vitamins tablet   Oral   Take 1 tablet by mouth daily.         Marland Kitchen levothyroxine (LEVOTHROID) 25 MCG tablet   Oral   Take 1 tablet (25 mcg total) by mouth daily.   90 tablet   3   . Misc Natural Products (OSTEO BI-FLEX ADV DOUBLE ST PO)   Oral   Take by mouth 2 (two) times daily.         . Multiple Vitamins-Minerals (CENTRUM SILVER PO)   Oral   Take by mouth daily.         . naproxen sodium (ANAPROX) 220 MG tablet   Oral   Take 440 mg by mouth daily as needed (for pain).         . pravastatin (PRAVACHOL) 40 MG tablet   Oral   Take 1 tablet (40 mg total) by mouth every  evening.   90 tablet   3     BP 90/55  Pulse 69  Temp(Src) 98.6 F (37 C) (Oral)  Resp 23  SpO2 98%  Physical Exam  Nursing note and vitals reviewed. Constitutional: He appears well-developed and well-nourished. No distress.  Laying in bed. NAD. Tired appearing, but not toxic.   HENT:  Head: Normocephalic and atraumatic.  Eyes: Conjunctivae are normal. Right eye exhibits no discharge. Left eye exhibits no discharge.  Neck: Neck supple.  Cardiovascular: Normal rate, regular rhythm and normal heart sounds.  Exam reveals no gallop and no friction rub.   No murmur heard. Pulmonary/Chest: Effort normal and breath sounds normal. No respiratory distress.  Abdominal: Soft. He exhibits no distension. There is no tenderness.  Genitourinary:  No cva tenderness  Musculoskeletal: He exhibits no edema and no tenderness.  Neurological: He is alert.  Skin: Skin is warm and dry.  Psychiatric: He has a normal mood and affect. His behavior is normal. Thought content normal.    ED Course   Procedures (including critical care time)  CRITICAL CARE Performed by: Raeford Razor   Total critical care time: 35 minutes  Critical care time was exclusive of separately billable procedures and treating other patients.  Critical care was necessary to treat or prevent imminent or life-threatening deterioration.  Critical care was time spent personally by me on the following activities: development of treatment plan with patient and/or surrogate as well as nursing, discussions with consultants, evaluation of patient's response to treatment, examination of patient, obtaining history from patient or surrogate, ordering and performing treatments and interventions, ordering and review of laboratory studies, ordering and review of radiographic studies, pulse oximetry and re-evaluation of patient's condition.   Labs Reviewed  CBC WITH DIFFERENTIAL - Abnormal; Notable for the following:    WBC 13.7 (*)    Platelets 129 (*)    Neutrophils Relative 95 (*)    Neutro Abs 13.0 (*)    Lymphocytes Relative 2 (*)    Lymphs Abs 0.3 (*)    All other components within normal limits  COMPREHENSIVE METABOLIC PANEL - Abnormal; Notable for the following:    Glucose, Bld 195 (*)    BUN 26 (*)    GFR calc non Af Amer 88 (*)    All other components within normal limits  URINALYSIS, ROUTINE W REFLEX MICROSCOPIC - Abnormal; Notable for the following:    Color, Urine AMBER (*)    APPearance CLOUDY (*)    Specific Gravity, Urine 1.031 (*)    Glucose, UA 100 (*)    Hgb urine dipstick LARGE (*)    Ketones, ur 15 (*)    Protein, ur 30 (*)    Nitrite POSITIVE (*)    Leukocytes, UA LARGE (*)    All other components within normal limits  URINE MICROSCOPIC-ADD ON - Abnormal; Notable for the following:    Bacteria, UA MANY (*)    All other components within normal limits  URINE CULTURE  CULTURE, BLOOD (ROUTINE X 2)  CULTURE, BLOOD (ROUTINE X 2)  CG4 I-STAT (LACTIC ACID)   Dg Chest 2 View  02/14/2013   *RADIOLOGY REPORT*  Clinical Data: Fever.  Tremors.  CHEST - 2 VIEW  Comparison: None.  Findings:  Cardiopericardial silhouette within normal limits. Mediastinal contours normal. Trachea midline.  No airspace disease or effusion. No pneumothorax.  IMPRESSION: No active cardiopulmonary disease.   Original Report Authenticated By: Andreas Newport, M.D.      1. Sepsis  2. UTI (urinary tract infection)       MDM  66yM with fever, urinary urgency and generalized fatigue. UA consistent with UTI. Initial BP ok but developed hypotension while in ED. Responding to IVF. Lactic acid normal. Pt hyperglycemic w/o diagnosed hx of DM. Possibly related to acute stress, but still somewhat high for this in nondiabetic. Pt fairly healthy otherwise. Discussed with hospitalist for admission.         Raeford Razor, MD 02/15/13 [phone]

## 2013-02-15 ENCOUNTER — Inpatient Hospital Stay (HOSPITAL_COMMUNITY): Payer: Managed Care, Other (non HMO)

## 2013-02-15 ENCOUNTER — Encounter (HOSPITAL_COMMUNITY): Payer: Self-pay | Admitting: Internal Medicine

## 2013-02-15 DIAGNOSIS — R509 Fever, unspecified: Secondary | ICD-10-CM | POA: Diagnosis present

## 2013-02-15 DIAGNOSIS — D696 Thrombocytopenia, unspecified: Secondary | ICD-10-CM | POA: Diagnosis present

## 2013-02-15 DIAGNOSIS — N390 Urinary tract infection, site not specified: Secondary | ICD-10-CM | POA: Diagnosis present

## 2013-02-15 DIAGNOSIS — R6510 Systemic inflammatory response syndrome (SIRS) of non-infectious origin without acute organ dysfunction: Secondary | ICD-10-CM

## 2013-02-15 DIAGNOSIS — E039 Hypothyroidism, unspecified: Secondary | ICD-10-CM

## 2013-02-15 MED ORDER — ONDANSETRON HCL 4 MG PO TABS
4.0000 mg | ORAL_TABLET | Freq: Four times a day (QID) | ORAL | Status: DC | PRN
  Administered 2013-02-16: 4 mg via ORAL
  Filled 2013-02-15: qty 1

## 2013-02-15 MED ORDER — ONDANSETRON HCL 4 MG/2ML IJ SOLN: 4.0000 mg | Freq: Four times a day (QID) | INTRAMUSCULAR | Status: DC | PRN

## 2013-02-15 MED ORDER — LEVOTHYROXINE SODIUM 25 MCG PO TABS
25.0000 ug | ORAL_TABLET | Freq: Every day | ORAL | Status: DC
  Administered 2013-02-15 – 2013-02-17 (×3): 25 ug via ORAL
  Filled 2013-02-15 (×4): qty 1

## 2013-02-15 MED ORDER — SODIUM CHLORIDE 0.9 % IV SOLN
INTRAVENOUS | Status: AC
  Administered 2013-02-15 (×2): via INTRAVENOUS

## 2013-02-15 MED ORDER — SIMVASTATIN 20 MG PO TABS
20.0000 mg | ORAL_TABLET | Freq: Every day | ORAL | Status: DC
  Administered 2013-02-15: 20 mg via ORAL
  Filled 2013-02-15 (×3): qty 1

## 2013-02-15 MED ORDER — ACETAMINOPHEN 325 MG PO TABS: 975.0000 mg | ORAL_TABLET | Freq: Four times a day (QID) | ORAL | Status: DC | PRN

## 2013-02-15 MED ORDER — SODIUM CHLORIDE 0.9 % IJ SOLN
3.0000 mL | Freq: Two times a day (BID) | INTRAMUSCULAR | Status: DC
  Administered 2013-02-16: 3 mL via INTRAVENOUS

## 2013-02-15 MED ORDER — ENOXAPARIN SODIUM 40 MG/0.4ML ~~LOC~~ SOLN
40.0000 mg | SUBCUTANEOUS | Status: DC
  Administered 2013-02-15: 40 mg via SUBCUTANEOUS
  Filled 2013-02-15 (×3): qty 0.4

## 2013-02-15 MED ORDER — ACETAMINOPHEN 650 MG RE SUPP: 650.0000 mg | Freq: Four times a day (QID) | RECTAL | Status: DC | PRN

## 2013-02-15 MED ORDER — TRAMADOL HCL 50 MG PO TABS
50.0000 mg | ORAL_TABLET | Freq: Four times a day (QID) | ORAL | Status: DC | PRN
  Administered 2013-02-15 – 2013-02-16 (×3): 50 mg via ORAL
  Filled 2013-02-15 (×3): qty 1

## 2013-02-15 MED ORDER — ACETAMINOPHEN 325 MG PO TABS
650.0000 mg | ORAL_TABLET | Freq: Four times a day (QID) | ORAL | Status: DC | PRN
  Administered 2013-02-15 – 2013-02-17 (×3): 650 mg via ORAL
  Filled 2013-02-15 (×3): qty 2

## 2013-02-15 MED ORDER — DEXTROSE 5 % IV SOLN
1.0000 g | INTRAVENOUS | Status: DC
  Administered 2013-02-15 – 2013-02-16 (×2): 1 g via INTRAVENOUS
  Filled 2013-02-15 (×4): qty 10

## 2013-02-15 NOTE — H&P (Signed)
[facility] History and Physical  LESTON SCHUELLER GNF:[id number] DOB: 11-11-1945 DOA: 02/14/2013  Referring physician: Dr. Juleen China. PCP: Oliver Barre, MD  Specialists: None.  Chief Complaint: Fever and chills.  HPI: Kevin Stein is a 67 y.o. male with history of hypothyroidism hyperlipidemia presented to the ER because of persistent fever and chills over the last 24 hours. Patient has been having increased frequency of urination for last couple of days. Denies any abdominal pain dysuria diarrhea did have some nausea but denies any vomiting. Denies any chest pain or short of breath or a productive cough. In the ER patient's UA was compatible with UTI. Patient also had mild thrombocytopenia. Patient on presentation was found to be mildly hypotensive and is responding to fluid. Otherwise patient does not look toxic. Patient will be admitted for SIRS from UTI.  Review of Systems: As presented in the history of presenting illness, rest negative.  Past Medical History  Diagnosis Date  . HYPOTHYROIDISM 07/06/2008    Qualifier: Diagnosis of  By: Jonny Ruiz MD, Len Blalock   . HYPERLIPIDEMIA 07/06/2008    Qualifier: Diagnosis of  By: Jonny Ruiz MD, Len Blalock   . ALLERGIC RHINITIS 07/06/2008    Qualifier: Diagnosis of  By: Jonny Ruiz MD, Len Blalock   . 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 DIVERTICULOSIS, COLON 07/06/2008    Qualifier: Diagnosis of  By: Jonny Ruiz MD, Len Blalock   . DEGENERATIVE JOINT DISEASE, RIGHT SHOULDER 07/06/2008    Qualifier: Diagnosis of  By: Jonny Ruiz MD, Len Blalock   . COLONIC POLYPS, HX OF 07/06/2008    Qualifier: Diagnosis of  By: Jonny Ruiz MD, Len Blalock   . BENIGN PROSTATIC HYPERTROPHY 07/06/2008    Qualifier: Diagnosis of  By: Jonny Ruiz MD, Len Blalock    Past Surgical History  Procedure Laterality Date  . Vasectomy    . Left middle ear surgury    . Right shoulder surgury  2010    Dr Ranell Patrick   Social History:  reports that he has never smoked. He has never used  smokeless tobacco. He reports that  drinks alcohol. He reports that he does not use illicit drugs.  lives at home. where does patient live--  can do ADL.  Can patient participate in ADLs?  No Known Allergies  Family History  Problem Relation Age of Onset  . Diabetes Mother   . Hypertension Mother   . Cancer Father       Prior to Admission medications   Medication Sig Start Date End Date Taking? Authorizing Provider  acetaminophen (TYLENOL) 325 MG tablet Take 975 mg by mouth every 6 (six) hours as needed for pain.   Yes Historical Provider, MD  b complex vitamins tablet Take 1 tablet by mouth daily.   Yes Historical Provider, MD  levothyroxine (LEVOTHROID) 25 MCG tablet Take 1 tablet (25 mcg total) by mouth daily. 06/09/12 06/09/13 Yes Corwin Levins, MD  Misc Natural Products (OSTEO BI-FLEX ADV DOUBLE ST PO) Take by mouth 2 (two) times daily.   Yes Historical Provider, MD  Multiple Vitamins-Minerals (CENTRUM SILVER PO) Take by mouth daily.   Yes Historical Provider, MD  naproxen sodium (ANAPROX) 220 MG tablet Take 440 mg by mouth daily as needed (for pain).   Yes Historical Provider, MD  pravastatin (PRAVACHOL) 40 MG tablet Take 1 tablet (40 mg total) by mouth every evening. 06/09/12 06/09/13 Yes Corwin Levins, MD   Physical Exam:  Filed Vitals:   02/14/13 2230 02/14/13 2240 02/14/13 2245 02/14/13 2300  BP: 88/52  97/51 90/55  Pulse: 68  70 69  Temp:  98.6 F (37 C)    TempSrc:  Oral    Resp: 17  15 23   SpO2: 95%  97% 98%     General:  Well-developed and nourished.  Eyes: Anicteric no pallor.  ENT:  no discharge from the ears eyes nose or mouth.   Neck:  No mass felt.  Cardiovascular:S1-S2 heard.   Respiratory:  no rhonchi no crepitations.  Abdomen:  soft nontender bowel sounds present.   Skin:  no rash.  Musculoskeletal: No edema.   Psychiatric:Appears normal.   Neurologic:  alert awake oriented to time place and person. Moves all extremities.  Labs on Admission:   Basic Metabolic Panel:  Recent Labs Lab 02/14/13 1853  NA 138  K 3.7  CL 105  CO2 23  GLUCOSE 195*  BUN 26*  CREATININE 0.91  CALCIUM 9.9   Liver Function Tests:  Recent Labs Lab 02/14/13 1853  AST 21  ALT 16  ALKPHOS 59  BILITOT 0.6  PROT 6.4  ALBUMIN 3.6   No results found for this basename: LIPASE, AMYLASE,  in the last 168 hours No results found for this basename: AMMONIA,  in the last 168 hours CBC:  Recent Labs Lab 02/14/13 1853  WBC 13.7*  NEUTROABS 13.0*  HGB 15.3  HCT 43.2  MCV 87.6  PLT 129*   Cardiac Enzymes: No results found for this basename: CKTOTAL, CKMB, CKMBINDEX, TROPONINI,  in the last 168 hours  BNP (last 3 results) No results found for this basename: PROBNP,  in the last 8760 hours CBG: No results found for this basename: GLUCAP,  in the last 168 hours  Radiological Exams on Admission: Dg Chest 2 View  02/14/2013  *RADIOLOGY REPORT*  Clinical Data: Fever.  Tremors.  CHEST - 2 VIEW  Comparison: None.  Findings:  Cardiopericardial silhouette within normal limits. Mediastinal contours normal. Trachea midline.  No airspace disease or effusion. No pneumothorax.  IMPRESSION: No active cardiopulmonary disease.   Original Report Authenticated By: Andreas Newport, M.D.      Assessment/Plan Principal Problem:   Fever Active Problems:   HYPOTHYROIDISM   HYPERLIPIDEMIA   UTI (lower urinary tract infection)   Thrombocytopenia   1. SIRS from UTI - patient was started on ceftriaxone. Continue with ceftriaxone. Follow urine cultures and blood cultures. Continue with hydration. 2. Mild thrombocytopenia probably from #1 recent - closely follow CBC. If there is significant worsening then check for DIC. 3. Hypothyroidism - continue Synthroid. Check TSH. 4. Hyperlipidemia - continue statins. 5. Low back pain - continue Tylenol. We'll hold off Naprosyn for now.    Code Status:  full code.   Family Communication:  patient's wife at the bedside.   Disposition Plan:  admit to inpatient.     Bonetta Mostek N. [facility] Pager [phone].   If 7PM-7AM, please contact night-coverage [email or web address] Password [facility] 02/15/2013, 12:16 AM

## 2013-02-15 NOTE — Progress Notes (Signed)
[facility] PROGRESS NOTE  Kevin Stein NWG:[id number] DOB: September 22, 1946 DOA: 02/14/2013 PCP: Oliver Barre, MD  Assessment/Plan: Principal Problem:   SIRS (systemic inflammatory response syndrome) Active Problems:   HYPOTHYROIDISM   HYPERLIPIDEMIA   Fever   UTI (lower urinary tract infection)   Thrombocytopenia    1. SIRS/UTI: Patient presented with 2-3 days of urinary frequency, culminating in fever, chills, in the setting of known BPH. BP was also borderline low, at 90/55, wcc 13.7, consistent with SIRS. BP normalized with iv fluid bolus, administered in the ED, and has remained so. Managing with iv Ceftriaxone, now day #2, and patient already feels better. Urine and blood cultures are pending. Will order renal U/S.  2. Mild thrombocytopenia: Platelet count was 129 on admission. Probably secondary to #1. Will follow CBC.  3. Hypothyroidism: Continued on pre-admission. Synthroid. TSH is pending. 4. Hyperlipidemia: On statins. 5. Low back pain: Not problematic this AM. PRN Tylenol.   Code Status: Full Code.  Family Communication:  Disposition Plan: To be determined.    Brief narrative: 67 y.o. male with history of hypothyroidism, hyperlipidemia, allergic rhinitis, diverticulosis, BPH, DJD,  Presenting to the ER because of persistent fever and chills over the last 24 hours, as well as increased frequency of urination for last couple of days. Denies any abdominal pain, dysuria or diarrhea, did have some nausea but denies any vomiting. Denies any chest pain or short of breath or a productive cough. In the ER patient's U/A was compatible with UTI. Patient also had mild thrombocytopenia. Patient on presentation was found to be mildly hypotensive, but otherwise patient did not look toxic. Admitted for SIRS from UTI.   Consultants:  N/A.   Procedures:  CXR.   Antibiotics:  Rocephin 02/14/13>>>  HPI/Subjective: Feels a bit better.   Objective: Vital signs in last 24  hours: Temp:  [98.6 F (37 C)-102.9 F (39.4 C)] 101.9 F (38.8 C) (04/28 0650) Pulse Rate:  [68-106] 94 (04/28 0650) Resp:  [15-23] 16 (04/28 0650) BP: (88-147)/(51-70) 110/64 mmHg (04/28 0650) SpO2:  [95 %-100 %] 97 % (04/28 0650) Weight:  [100.2 kg (220 lb 14.4 oz)] 100.2 kg (220 lb 14.4 oz) (04/28 0308) Weight change:  Last BM Date: 02/14/13  Intake/Output from previous day: 04/27 0701 - 04/28 0700 In: 3431.3 [I.V.:3431.3] Out: 850 [Urine:850]     Physical Exam: General: Comfortable, alert, communicative, fully oriented, not short of breath at rest.  HEENT:  No clinical pallor, no jaundice, no conjunctival injection or discharge. NECK:  Supple, JVP not seen, no carotid bruits, no palpable lymphadenopathy, no palpable goiter. CHEST:  Clinically clear to auscultation, no wheezes, no crackles. HEART:  Sounds 1 and 2 heard, normal, regular, no murmurs. ABDOMEN:  Full, soft, non-tender, no palpable organomegaly, no palpable masses, normal bowel sounds. GENITALIA:  Not examined. LOWER EXTREMITIES:  No pitting edema, palpable peripheral pulses. MUSCULOSKELETAL SYSTEM:  Generalized osteoarthritic changes, otherwise, normal. CENTRAL NERVOUS SYSTEM:  No focal neurologic deficit on gross examination.  Lab Results:  Recent Labs  02/14/13 1853  WBC 13.7*  HGB 15.3  HCT 43.2  PLT 129*    Recent Labs  02/14/13 1853  NA 138  K 3.7  CL 105  CO2 23  GLUCOSE 195*  BUN 26*  CREATININE 0.91  CALCIUM 9.9   No results found for this or any previous visit (from the past 240 hour(s)).   Studies/Results: Dg Chest 2 View  02/14/2013  *RADIOLOGY REPORT*  Clinical Data: Fever.  Tremors.  CHEST -  2 VIEW  Comparison: None.  Findings:  Cardiopericardial silhouette within normal limits. Mediastinal contours normal. Trachea midline.  No airspace disease or effusion. No pneumothorax.  IMPRESSION: No active cardiopulmonary disease.   Original Report Authenticated By: Andreas Newport, M.D.      Medications: Scheduled Meds: . cefTRIAXone (ROCEPHIN)  IV  1 g Intravenous Q24H  . enoxaparin (LOVENOX) injection  40 mg Subcutaneous Q24H  . levothyroxine  25 mcg Oral QAC breakfast  . simvastatin  20 mg Oral q1800  . sodium chloride  3 mL Intravenous Q12H   Continuous Infusions: . sodium chloride 125 mL/hr at 02/15/13 0330   PRN Meds:.acetaminophen, acetaminophen, acetaminophen, ondansetron (ZOFRAN) IV, ondansetron    LOS: 1 day   Kevin Stein,CHRISTOPHER  [facility] Pager [phone]. If 8PM-8AM, please contact night-coverage at [email or web address], password [facility] 02/15/2013, 7:08 AM  LOS: 1 day

## 2013-02-15 NOTE — Progress Notes (Signed)
Utilization Review Completed.Jodi Criscuolo T4/28/2014  

## 2013-02-15 NOTE — Care Management Note (Unsigned)
    Page 1 of 1   02/15/2013     4:09:27 PM   CARE MANAGEMENT NOTE 02/15/2013  Patient:  Kevin Stein, Kevin Stein   Account Number:  [id number]  Date Initiated:  02/15/2013  Documentation initiated by:  Marili Vader  Subjective/Objective Assessment:   PT ADM ON 02/14/13 WITH FEVER, UTI.  PTA, PT INDEPENDENT, LIVES WITH SPOUSE.     Action/Plan:   WILL FOLLOW FOR HOME NEEDS AS PT PROGRESSES.   Anticipated DC Date:  02/18/2013   Anticipated DC Plan:  HOME/SELF CARE      DC Planning Services  CM consult      Choice offered to / List presented to:             Status of service:  In process, will continue to follow Medicare Important Message given?   (If response is "NO", the following Medicare IM given date fields will be blank) Date Medicare IM given:   Date Additional Medicare IM given:    Discharge Disposition:    Per UR Regulation:  Reviewed for med. necessity/level of care/duration of stay  If discussed at Long Length of Stay Meetings, dates discussed:    Comments:

## 2013-02-16 DIAGNOSIS — N400 Enlarged prostate without lower urinary tract symptoms: Secondary | ICD-10-CM

## 2013-02-16 LAB — CBC
HCT: 37.6 % — ABNORMAL LOW (ref 39.0–52.0)
Hemoglobin: 13 g/dL (ref 13.0–17.0)
MCH: 30.7 pg (ref 26.0–34.0)
MCHC: 34.6 g/dL (ref 30.0–36.0)
MCV: 88.7 fL (ref 78.0–100.0)
RDW: 13.4 % (ref 11.5–15.5)

## 2013-02-16 LAB — BASIC METABOLIC PANEL
BUN: 16 mg/dL (ref 6–23)
Calcium: 8.4 mg/dL (ref 8.4–10.5)
Creatinine, Ser: 0.79 mg/dL (ref 0.50–1.35)
GFR calc non Af Amer: >90 mL/min (ref >90)
Glucose, Bld: 104 mg/dL — ABNORMAL HIGH (ref 70–99)
Potassium: 4 mEq/L (ref 3.5–5.1)

## 2013-02-16 MED ORDER — MORPHINE SULFATE 2 MG/ML IJ SOLN
2.0000 mg | INTRAMUSCULAR | Status: DC | PRN
  Administered 2013-02-16: 2 mg via INTRAVENOUS
  Filled 2013-02-16: qty 1

## 2013-02-16 MED ORDER — IBUPROFEN 400 MG PO TABS
400.0000 mg | ORAL_TABLET | Freq: Three times a day (TID) | ORAL | Status: DC
  Administered 2013-02-17: 400 mg via ORAL
  Filled 2013-02-16 (×6): qty 1

## 2013-02-16 MED ORDER — BUTALBITAL-APAP-CAFFEINE 50-325-40 MG PO TABS
1.0000 | ORAL_TABLET | ORAL | Status: DC | PRN
  Administered 2013-02-16: 1 via ORAL
  Filled 2013-02-16: qty 1

## 2013-02-16 NOTE — Progress Notes (Signed)
[facility] PROGRESS NOTE  JAYLAND NULL ZOX:[id number] DOB: 12/29/1945 DOA: 02/14/2013 PCP: Oliver Barre, MD  Assessment/Plan: Principal Problem:   SIRS (systemic inflammatory response syndrome) Active Problems:   HYPOTHYROIDISM   HYPERLIPIDEMIA   Fever   UTI (lower urinary tract infection)   Thrombocytopenia    1. SIRS/UTI: Patient presented with 2-3 days of urinary frequency, culminating in fever, chills, in the setting of known BPH. BP was also borderline low, at 90/55, wcc 13.7, consistent with SIRS. BP normalized with iv fluid bolus, administered in the ED, and has remained so. Managing with iv Ceftriaxone, now day #3, and patient feels better, although he has a persistent headache and had a low grade temp of 100.3 overnight. Blood cultures are negative so far. Urine culture grew E. Coli and sensitivities are awaited. Wcc has normalized. Will add NSAID, for headache.  2. Renal cyst/BPH: Renal U/S showed no evidence of hydronephrosis, and was other wise normal, apart from 2 cm septated cyst associated with the upper pole region of the right kidney. Follow-up ultrasound examination in 6-12  Months is recommended. Patient has a known history of BPH, in addition, and also states that even before his UTI, he has had nocturia (3-4 per night), and a trial of Flomax has failed in the past. Have discussed all this with the patient, and would recommend urology referral, by PMD.  3. Mild thrombocytopenia: Platelet count was 129 on admission. Probably secondary to #1. Will follow CBC.  4. Hypothyroidism: Continued on pre-admission. Synthroid. TSH is pending. 5. Hyperlipidemia: On statins. 5. Low back pain: Not problematic. PRN Tylenol.   Code Status: Full Code.  Family Communication:  Disposition Plan: To be determined. Aiming possible discharge on 02/17/13.    Brief narrative: 67 y.o. male with history of hypothyroidism, hyperlipidemia, allergic rhinitis, diverticulosis, BPH, DJD,   Presenting to the ER because of persistent fever and chills over the last 24 hours, as well as increased frequency of urination for last couple of days. Denies any abdominal pain, dysuria or diarrhea, did have some nausea but denies any vomiting. Denies any chest pain or short of breath or a productive cough. In the ER patient's U/A was compatible with UTI. Patient also had mild thrombocytopenia. Patient on presentation was found to be mildly hypotensive, but otherwise patient did not look toxic. Admitted for SIRS from UTI.   Consultants:  N/A.   Procedures:  CXR.   Antibiotics:  Rocephin 02/14/13>>>  HPI/Subjective: Improved, but still has persistent headache. .   Objective: Vital signs in last 24 hours: Temp:  [98.5 F (36.9 C)-100.3 F (37.9 C)] 99.5 F (37.5 C) (04/29 0541) Pulse Rate:  [77-78] 78 (04/29 0541) Resp:  [18-20] 18 (04/29 0541) BP: (116-124)/(56-70) 124/67 mmHg (04/29 0541) SpO2:  [96 %-100 %] 96 % (04/29 0541) Weight change:  Last BM Date: 02/15/13  Intake/Output from previous day: 04/28 0701 - 04/29 0700 In: 1970 [P.O.:720; I.V.:1200; IV Piggyback:50] Out: 1525 [Urine:1525] Total I/O In: 240 [P.O.:240] Out: 350 [Urine:350]   Physical Exam: General: Comfortable, alert, communicative, fully oriented, not short of breath at rest.  HEENT:  No clinical pallor, no jaundice, no conjunctival injection or discharge. NECK:  Supple, JVP not seen, no carotid bruits, no palpable lymphadenopathy, no palpable goiter. CHEST:  Clinically clear to auscultation, no wheezes, no crackles. HEART:  Sounds 1 and 2 heard, normal, regular, no murmurs. ABDOMEN:  Full, soft, non-tender, no palpable organomegaly, no palpable masses, normal bowel sounds. GENITALIA:  Not examined. LOWER EXTREMITIES:  No pitting edema, palpable peripheral pulses. MUSCULOSKELETAL SYSTEM:  Generalized osteoarthritic changes, otherwise, normal. CENTRAL NERVOUS SYSTEM:  No focal neurologic deficit on  gross examination.  Lab Results:  Recent Labs  02/14/13 1853 02/16/13 0535  WBC 13.7* 9.5  HGB 15.3 13.0  HCT 43.2 37.6*  PLT 129* 97*    Recent Labs  02/14/13 1853 02/16/13 0535  NA 138 138  K 3.7 4.0  CL 105 108  CO2 23 22  GLUCOSE 195* 104*  BUN 26* 16  CREATININE 0.91 0.79  CALCIUM 9.9 8.4   Recent Results (from the past 240 hour(s))  URINE CULTURE     Status: None   Collection Time    02/14/13  7:15 PM      Result Value Range Status   Specimen Description URINE, CLEAN CATCH   Final   Special Requests NONE   Final   Culture  Setup Time 02/15/2013 02:01   Final   Colony Count >=100,000 COLONIES/ML   Final   Culture ESCHERICHIA COLI   Final   Report Status PENDING   Incomplete  CULTURE, BLOOD (ROUTINE X 2)     Status: None   Collection Time    02/14/13 10:40 PM      Result Value Range Status   Specimen Description BLOOD RIGHT HAND   Final   Special Requests BOTTLES DRAWN AEROBIC AND ANAEROBIC 10CC EACH   Final   Culture  Setup Time 02/15/2013 01:33   Final   Culture     Final   Value:        BLOOD CULTURE RECEIVED NO GROWTH TO DATE CULTURE WILL BE HELD FOR 5 DAYS BEFORE ISSUING A FINAL NEGATIVE REPORT   Report Status PENDING   Incomplete  CULTURE, BLOOD (ROUTINE X 2)     Status: None   Collection Time    02/14/13 10:50 PM      Result Value Range Status   Specimen Description BLOOD LEFT ARM   Final   Special Requests BOTTLES DRAWN AEROBIC AND ANAEROBIC 10CC   Final   Culture  Setup Time 02/15/2013 01:33   Final   Culture     Final   Value:        BLOOD CULTURE RECEIVED NO GROWTH TO DATE CULTURE WILL BE HELD FOR 5 DAYS BEFORE ISSUING A FINAL NEGATIVE REPORT   Report Status PENDING   Incomplete     Studies/Results: Dg Chest 2 View  02/14/2013  *RADIOLOGY REPORT*  Clinical Data: Fever.  Tremors.  CHEST - 2 VIEW  Comparison: None.  Findings:  Cardiopericardial silhouette within normal limits. Mediastinal contours normal. Trachea midline.  No airspace disease  or effusion. No pneumothorax.  IMPRESSION: No active cardiopulmonary disease.   Original Report Authenticated By: Andreas Newport, M.D.    US Renal  02/15/2013  *RADIOLOGY REPORT*  Clinical Data: Urinary tract infection.  BPH.  RENAL/URINARY TRACT ULTRASOUND COMPLETE  Comparison:  None.  Findings:  Right Kidney:  13.3 cm in length. Normal renal cortical thickness and echogenicity without focal lesions or hydronephrosis.  A 1.9 x 1.9 x 1.9 upper pole cyst is noted.  Small septations are noted.  Left Kidney:  12.3 cm in length. Normal renal cortical thickness and echogenicity without focal lesions or hydronephrosis.  Bladder:  Normal  IMPRESSION:  1.  Normal renal parenchyma and no hydronephrosis. 2.  2 cm septated cyst associated with the upper pole region of the right kidney.  Recommend follow-up ultrasound examination in 6-12 months.  Original Report Authenticated By: Rudie Meyer, M.D.     Medications: Scheduled Meds: . cefTRIAXone (ROCEPHIN)  IV  1 g Intravenous Q24H  . enoxaparin (LOVENOX) injection  40 mg Subcutaneous Q24H  . levothyroxine  25 mcg Oral QAC breakfast  . simvastatin  20 mg Oral q1800  . sodium chloride  3 mL Intravenous Q12H   Continuous Infusions:   PRN Meds:.acetaminophen, acetaminophen, acetaminophen, ondansetron (ZOFRAN) IV, ondansetron, traMADol    LOS: 2 days   Stephanie Littman,CHRISTOPHER  [facility] Pager [phone]. If 8PM-8AM, please contact night-coverage at [email or web address], password [facility] 02/16/2013, 11:14 AM  LOS: 2 days

## 2013-02-17 DIAGNOSIS — A419 Sepsis, unspecified organism: Secondary | ICD-10-CM

## 2013-02-17 LAB — URINE CULTURE: Colony Count: >=100000

## 2013-02-17 LAB — BASIC METABOLIC PANEL
Calcium: 8.8 mg/dL (ref 8.4–10.5)
GFR calc Af Amer: >90 mL/min (ref >90)
GFR calc non Af Amer: 88 mL/min — ABNORMAL LOW (ref >90)
Glucose, Bld: 93 mg/dL (ref 70–99)
Sodium: 139 mEq/L (ref 135–145)

## 2013-02-17 LAB — CBC
Hemoglobin: 12.7 g/dL — ABNORMAL LOW (ref 13.0–17.0)
MCH: 30.4 pg (ref 26.0–34.0)
MCHC: 34.3 g/dL (ref 30.0–36.0)
RDW: 13 % (ref 11.5–15.5)

## 2013-02-17 MED ORDER — TAMSULOSIN HCL 0.4 MG PO CAPS: 0.4000 mg | ORAL_CAPSULE | Freq: Every day | ORAL | Status: DC

## 2013-02-17 MED ORDER — TAMSULOSIN HCL 0.4 MG PO CAPS
0.4000 mg | ORAL_CAPSULE | Freq: Every day | ORAL | Status: DC
  Filled 2013-02-17: qty 1

## 2013-02-17 MED ORDER — CEPHALEXIN 500 MG PO CAPS: 500.0000 mg | ORAL_CAPSULE | Freq: Two times a day (BID) | ORAL | Status: DC

## 2013-02-17 MED ORDER — TRAMADOL HCL 50 MG PO TABS
50.0000 mg | ORAL_TABLET | Freq: Four times a day (QID) | ORAL | Status: DC | PRN
Start: 2013-02-17 — End: 2013-06-09

## 2013-02-17 NOTE — Progress Notes (Signed)
Physician Discharge Summary  Kevin Stein ZOX:[id number] DOB: [birth year] DOA: 02/14/2013  PCP: Oliver Barre, MD  Admit date: 02/14/2013 Discharge date: 02/17/2013  Time spent:35 minutes  Recommendations for Outpatient Follow-up:  1. CBC 2. Renal u/s 3. Referral to urology  Discharge Diagnoses:  Principal Problem:   SIRS (systemic inflammatory response syndrome) Active Problems:   HYPOTHYROIDISM   HYPERLIPIDEMIA   Fever   UTI (lower urinary tract infection)   Thrombocytopenia   Discharge Condition: improved  Diet recommendation: cardiac  Filed Weights   02/15/13 0306 02/15/13 0308  Weight: 100.2 kg (220 lb 14.4 oz) 100.2 kg (220 lb 14.4 oz)    History of present illness:  Kevin Stein is a 67 y.o. male with history of hypothyroidism hyperlipidemia presented to the ER because of persistent fever and chills over the last 24 hours. Patient has been having increased frequency of urination for last couple of days. Denies any abdominal pain dysuria diarrhea did have some nausea but denies any vomiting. Denies any chest pain or short of breath or a productive cough. In the ER patient's UA was compatible with UTI. Patient also had mild thrombocytopenia. Patient on presentation was found to be mildly hypotensive and is responding to fluid. Otherwise patient does not look toxic. Patient will be admitted for SIRS from UTI.   Hospital Course:  1. SIRS/UTI: Patient presented with 2-3 days of urinary frequency, culminating in fever, chills, in the setting of known BPH. BP was also borderline low, at 90/55, wcc 13.7, consistent with SIRS. BP normalized with iv fluid bolus, administered in the ED, and has remained so. Managing with iv Ceftriaxone, now day #3- change to PO for 10 day course, and patient feels better. Blood cultures are negative so far. Urine culture grew E. Coli. Wcc has normalized.  2. Renal cyst/BPH: Renal U/S showed no evidence of hydronephrosis, and was other wise  normal, apart from 2 cm septated cyst associated with the upper pole region of the right kidney. Follow-up ultrasound examination in 6-12 Months is recommended. Patient has a known history of BPH, restart folmax and refer to urology 3. Mild thrombocytopenia: Platelet count was 129 on admission. Probably secondary to #1. CBC as outaptient 4. Hypothyroidism: Continued on pre-admission. Synthroid.  5. Hyperlipidemia: On statins.  5. Low back pain:    Procedures:  u/s  Consultations:  none  Discharge Exam: Filed Vitals:   02/16/13 0541 02/16/13 1345 02/16/13 2006 02/17/13 0543  BP: 124/67 140/80 133/78 119/67  Pulse: 78 75 83 72  Temp: 99.5 F (37.5 C) 99.6 F (37.6 C) 100.4 F (38 C) 98.4 F (36.9 C)  TempSrc: Oral Oral Oral Oral  Resp: 18 18 18 18   Height:      Weight:      SpO2: 96% 100% 96% 99%    General: A+Ox3, NAd Cardiovascular: rrr Respiratory: clear anterior  Discharge Instructions      Discharge Orders   Future Appointments Provider Department Dept Phone   06/11/2013 8:30 AM Corwin Levins, MD [facility] [phone]   Future Orders Complete By Expires     Discharge instructions  As directed     Comments:      F/up renal U/S 6-12 months Outpatient referral to urology        Medication List    TAKE these medications       acetaminophen 325 MG tablet  Commonly known as:  TYLENOL  Take 975 mg by mouth every 6 (six) hours  as needed for pain.     b complex vitamins tablet  Take 1 tablet by mouth daily.     CENTRUM SILVER PO  Take by mouth daily.     cephALEXin 500 MG capsule  Commonly known as:  KEFLEX  Take 1 capsule (500 mg total) by mouth 2 (two) times daily.     levothyroxine 25 MCG tablet  Commonly known as:  LEVOTHROID  Take 1 tablet (25 mcg total) by mouth daily.     naproxen sodium 220 MG tablet  Commonly known as:  ANAPROX  Take 440 mg by mouth daily as needed (for pain).     OSTEO BI-FLEX ADV DOUBLE ST PO   Take by mouth 2 (two) times daily.     pravastatin 40 MG tablet  Commonly known as:  PRAVACHOL  Take 1 tablet (40 mg total) by mouth every evening.     tamsulosin 0.4 MG Caps  Commonly known as:  FLOMAX  Take 1 capsule (0.4 mg total) by mouth daily after supper.     traMADol 50 MG tablet  Commonly known as:  ULTRAM  Take 1 tablet (50 mg total) by mouth every 6 (six) hours as needed.       Follow-up Information   Follow up with Oliver Barre, MD. (referral to urology)    Contact information:   [address] [place] [address] [phone]        The results of significant diagnostics from this hospitalization (including imaging, microbiology, ancillary and laboratory) are listed below for reference.    Significant Diagnostic Studies: Dg Chest 2 View  02/14/2013  *RADIOLOGY REPORT*  Clinical Data: Fever.  Tremors.  CHEST - 2 VIEW  Comparison: None.  Findings:  Cardiopericardial silhouette within normal limits. Mediastinal contours normal. Trachea midline.  No airspace disease or effusion. No pneumothorax.  IMPRESSION: No active cardiopulmonary disease.   Original Report Authenticated By: Andreas Newport, M.D.    US Renal  02/15/2013  *RADIOLOGY REPORT*  Clinical Data: Urinary tract infection.  BPH.  RENAL/URINARY TRACT ULTRASOUND COMPLETE  Comparison:  None.  Findings:  Right Kidney:  13.3 cm in length. Normal renal cortical thickness and echogenicity without focal lesions or hydronephrosis.  A 1.9 x 1.9 x 1.9 upper pole cyst is noted.  Small septations are noted.  Left Kidney:  12.3 cm in length. Normal renal cortical thickness and echogenicity without focal lesions or hydronephrosis.  Bladder:  Normal  IMPRESSION:  1.  Normal renal parenchyma and no hydronephrosis. 2.  2 cm septated cyst associated with the upper pole region of the right kidney.  Recommend follow-up ultrasound examination in 6-12 months.   Original Report Authenticated By: Rudie Meyer, M.D.      Microbiology: Recent Results (from the past 240 hour(s))  URINE CULTURE     Status: None   Collection Time    02/14/13  7:15 PM      Result Value Range Status   Specimen Description URINE, CLEAN CATCH   Final   Special Requests NONE   Final   Culture  Setup Time 02/15/2013 02:01   Final   Colony Count >=100,000 COLONIES/ML   Final   Culture ESCHERICHIA COLI   Final   Report Status 02/17/2013 FINAL   Final   Organism ID, Bacteria ESCHERICHIA COLI   Final  CULTURE, BLOOD (ROUTINE X 2)     Status: None   Collection Time    02/14/13 10:40 PM  Result Value Range Status   Specimen Description BLOOD RIGHT HAND   Final   Special Requests BOTTLES DRAWN AEROBIC AND ANAEROBIC 10CC EACH   Final   Culture  Setup Time 02/15/2013 01:33   Final   Culture     Final   Value:        BLOOD CULTURE RECEIVED NO GROWTH TO DATE CULTURE WILL BE HELD FOR 5 DAYS BEFORE ISSUING A FINAL NEGATIVE REPORT   Report Status PENDING   Incomplete  CULTURE, BLOOD (ROUTINE X 2)     Status: None   Collection Time    02/14/13 10:50 PM      Result Value Range Status   Specimen Description BLOOD LEFT ARM   Final   Special Requests BOTTLES DRAWN AEROBIC AND ANAEROBIC 10CC   Final   Culture  Setup Time 02/15/2013 01:33   Final   Culture     Final   Value:        BLOOD CULTURE RECEIVED NO GROWTH TO DATE CULTURE WILL BE HELD FOR 5 DAYS BEFORE ISSUING A FINAL NEGATIVE REPORT   Report Status PENDING   Incomplete     Labs: Basic Metabolic Panel:  Recent Labs Lab 02/14/13 1853 02/16/13 0535 02/17/13 0450  NA 138 138 139  K 3.7 4.0 3.8  CL 105 108 106  CO2 23 22 26   GLUCOSE 195* 104* 93  BUN 26* 16 12  CREATININE 0.91 0.79 0.89  CALCIUM 9.9 8.4 8.8   Liver Function Tests:  Recent Labs Lab 02/14/13 1853  AST 21  ALT 16  ALKPHOS 59  BILITOT 0.6  PROT 6.4  ALBUMIN 3.6   No results found for this basename: LIPASE, AMYLASE,  in the last 168 hours No results found for this basename: AMMONIA,  in the  last 168 hours CBC:  Recent Labs Lab 02/14/13 1853 02/16/13 0535 02/17/13 0450  WBC 13.7* 9.5 4.1  NEUTROABS 13.0*  --   --   HGB 15.3 13.0 12.7*  HCT 43.2 37.6* 37.0*  MCV 87.6 88.7 88.5  PLT 129* 97* 99*   Cardiac Enzymes: No results found for this basename: CKTOTAL, CKMB, CKMBINDEX, TROPONINI,  in the last 168 hours BNP: BNP (last 3 results) No results found for this basename: PROBNP,  in the last 8760 hours CBG: No results found for this basename: GLUCAP,  in the last 168 hours     Signed:  Marlin Canary  [facility] 02/17/2013, 12:37 PM

## 2013-02-17 NOTE — Progress Notes (Signed)
Pt/family given discharge instructions, medication lists, follow up appointments, and when to call the doctor.  Pt/family verbalizes understanding. Kevin Stein    

## 2013-02-21 LAB — CULTURE, BLOOD (ROUTINE X 2)
Culture: NO GROWTH
Culture: NO GROWTH

## 2013-02-24 ENCOUNTER — Ambulatory Visit (INDEPENDENT_AMBULATORY_CARE_PROVIDER_SITE_OTHER): Payer: Managed Care, Other (non HMO) | Admitting: Internal Medicine

## 2013-02-24 ENCOUNTER — Encounter: Payer: Self-pay | Admitting: Internal Medicine

## 2013-02-24 VITALS — BP 114/72 | HR 77 | Temp 99.0°F | Ht 72.0 in | Wt 218.2 lb

## 2013-02-24 DIAGNOSIS — N281 Cyst of kidney, acquired: Secondary | ICD-10-CM

## 2013-02-24 DIAGNOSIS — N400 Enlarged prostate without lower urinary tract symptoms: Secondary | ICD-10-CM

## 2013-02-24 DIAGNOSIS — N481 Balanitis: Secondary | ICD-10-CM

## 2013-02-24 DIAGNOSIS — N476 Balanoposthitis: Secondary | ICD-10-CM

## 2013-02-24 DIAGNOSIS — Q619 Cystic kidney disease, unspecified: Secondary | ICD-10-CM

## 2013-02-24 DIAGNOSIS — N390 Urinary tract infection, site not specified: Secondary | ICD-10-CM

## 2013-02-24 MED ORDER — CLOTRIMAZOLE 1 % EX CREA: TOPICAL_CREAM | Freq: Two times a day (BID) | CUTANEOUS | Status: DC

## 2013-02-24 NOTE — Patient Instructions (Signed)
Please finish the antibiotic as you have Please continue all other medications as before, including the flomax Please take all new medication as prescribed - the lotrimin cream You will be contacted regarding the referral for: urology Please remember you should likely have a Renal Ultrasound repeated at 6 months, either with the urologist or myself (unless you are told differently by the urologist) Thank you for enrolling in MyChart. Please follow the instructions below to securely access your online medical record. MyChart allows you to send messages to your doctor, view your test results, renew your prescriptions, schedule appointments, and more. To Log into My Chart online, please go by [employer] or [employer] to [email or web address], or download the MyChart App from the [employer] .  Your Username is:  [email or web address] (pass [email or web address]) Please return in 3 months, or sooner if needed, with Lab testing done 3-5 days before

## 2013-02-24 NOTE — Progress Notes (Signed)
Subjective:    Patient ID: Kevin Stein, male    DOB: 27-Aug-1946, 67 y.o.   MRN: [id number]  HPI  Here to f/u; Denies urinary symptoms such as dysuria, frequency, urgency, flank pain, hematuria or n/v, fever, chills, finishing antibx.  Has known renal cyst, has urology eval pending.  On flomax now with ? Decreased nocturia.  Does have some tenderness of the glans penis, and a small rash to the left inner thigh.  Also with signficant post infectious fatigue.  Pt denies chest pain, increased sob or doe, wheezing, orthopnea, PND, increased LE swelling, palpitations, dizziness or syncope.  Pt denies new neurological symptoms such as new headache, or facial or extremity weakness or numbness   Pt denies polydipsia, polyuria,   Does not yet have urology referral Past Medical History  Diagnosis Date  . HYPOTHYROIDISM 07/06/2008    Qualifier: Diagnosis of  By: Jonny Ruiz MD, Len Blalock   . HYPERLIPIDEMIA 07/06/2008    Qualifier: Diagnosis of  By: Jonny Ruiz MD, Len Blalock   . ALLERGIC RHINITIS 07/06/2008    Qualifier: Diagnosis of  By: Jonny Ruiz MD, Len Blalock   . 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 DIVERTICULOSIS, COLON 07/06/2008    Qualifier: Diagnosis of  By: Jonny Ruiz MD, Len Blalock   . DEGENERATIVE JOINT DISEASE, RIGHT SHOULDER 07/06/2008    Qualifier: Diagnosis of  By: Jonny Ruiz MD, Len Blalock   . COLONIC POLYPS, HX OF 07/06/2008    Qualifier: Diagnosis of  By: Jonny Ruiz MD, Len Blalock   . BENIGN PROSTATIC HYPERTROPHY 07/06/2008    Qualifier: Diagnosis of  By: Jonny Ruiz MD, Len Blalock    Past Surgical History  Procedure Laterality Date  . Vasectomy    . Left middle ear surgury    . Right shoulder surgury  2010    Dr Ranell Patrick    reports that he has never smoked. He has never used smokeless tobacco. He reports that  drinks alcohol. He reports that he does not use illicit drugs. family history includes Cancer in his father; Diabetes in his mother; and Hypertension in his mother. No Known  Allergies Current Outpatient Prescriptions on File Prior to Visit  Medication Sig Dispense Refill  . acetaminophen (TYLENOL) 325 MG tablet Take 975 mg by mouth every 6 (six) hours as needed for pain.      Marland Kitchen b complex vitamins tablet Take 1 tablet by mouth daily.      Marland Kitchen levothyroxine (LEVOTHROID) 25 MCG tablet Take 1 tablet (25 mcg total) by mouth daily.  90 tablet  3  . Misc Natural Products (OSTEO BI-FLEX ADV DOUBLE ST PO) Take by mouth 2 (two) times daily.      . Multiple Vitamins-Minerals (CENTRUM SILVER PO) Take by mouth daily.      . naproxen sodium (ANAPROX) 220 MG tablet Take 440 mg by mouth daily as needed (for pain).      . pravastatin (PRAVACHOL) 40 MG tablet Take 1 tablet (40 mg total) by mouth every evening.  90 tablet  3  . tamsulosin (FLOMAX) 0.4 MG CAPS Take 1 capsule (0.4 mg total) by mouth daily after supper.  30 capsule  1  . traMADol (ULTRAM) 50 MG tablet Take 1 tablet (50 mg total) by mouth every 6 (six) hours as needed.  15 tablet  0   No current facility-administered medications on file prior to visit.   Review of Systems  Constitutional: Negative for  unexpected weight change, or unusual diaphoresis  HENT: Negative for tinnitus.   Eyes: Negative for photophobia and visual disturbance.  Respiratory: Negative for choking and stridor.   Gastrointestinal: Negative for vomiting and blood in stool.  Genitourinary: Negative for hematuria and decreased urine volume.  Musculoskeletal: Negative for acute joint swelling Skin: Negative for color change and wound.  Neurological: Negative for tremors and numbness other than noted  Psychiatric/Behavioral: Negative for decreased concentration or  hyperactivity.       Objective:   Physical Exam BP 114/72  Pulse 77  Temp(Src) 99 F (37.2 C) (Oral)  Ht 6' (1.829 m)  Wt 218 lb 4 oz (98.998 kg)  BMI 29.59 kg/m2  SpO2 97% VS noted,  Constitutional: Pt appears well-developed and well-nourished.  HENT: Head: NCAT.  Right Ear:  External ear normal.  Left Ear: External ear normal.  Eyes: Conjunctivae and EOM are normal. Pupils are equal, round, and reactive to light.  Neck: Normal range of motion. Neck supple.  Cardiovascular: Normal rate and regular rhythm.   Pulmonary/Chest: Effort normal and breath sounds normal.  Abd:  Soft, NT, non-distended, + BS, no flank tender Glans penis with mild diffuse nontender erythema Neurological: Pt is alert. Not confused  Skin: Skin is warm. No erythema.  Psychiatric: Pt behavior is normal. Thought content normal.     Assessment & Plan:

## 2013-02-28 NOTE — Assessment & Plan Note (Signed)
To cont flomax, f/u urology as planned

## 2013-02-28 NOTE — Assessment & Plan Note (Signed)
Likely needs f/u u/s at 6 months, pt plans to fu with urology

## 2013-02-28 NOTE — Assessment & Plan Note (Signed)
Likely fungal, Mild to mod, for antibx course,  to f/u any worsening symptoms or concerns

## 2013-02-28 NOTE — Assessment & Plan Note (Signed)
Improved, to finish antibx 

## 2013-03-12 ENCOUNTER — Other Ambulatory Visit: Payer: Self-pay | Admitting: Specialist

## 2013-03-12 DIAGNOSIS — M549 Dorsalgia, unspecified: Secondary | ICD-10-CM

## 2013-03-26 ENCOUNTER — Ambulatory Visit
Admission: RE | Admit: 2013-03-26 | Discharge: 2013-03-26 | Disposition: A | Discharge status: Home / Self Care | Payer: Managed Care, Other (non HMO) | Source: Ambulatory Visit | Attending: Specialist | Admitting: Specialist

## 2013-03-26 VITALS — BP 128/66 | HR 66

## 2013-03-26 DIAGNOSIS — M549 Dorsalgia, unspecified: Secondary | ICD-10-CM

## 2013-03-26 MED ORDER — DIAZEPAM 5 MG PO TABS
10.0000 mg | ORAL_TABLET | Freq: Once | ORAL | Status: AC
  Administered 2013-03-26: 10 mg via ORAL

## 2013-03-26 MED ORDER — IOHEXOL 180 MG/ML  SOLN: 15.0000 mL | Freq: Once | INTRAMUSCULAR | Status: AC | PRN

## 2013-03-26 NOTE — Progress Notes (Signed)
Pt states has been tramadol for past 2 days.

## 2013-04-25 ENCOUNTER — Other Ambulatory Visit: Payer: Self-pay | Admitting: Internal Medicine

## 2013-04-26 ENCOUNTER — Telehealth: Payer: Self-pay

## 2013-04-26 MED ORDER — TAMSULOSIN HCL 0.4 MG PO CAPS: 0.4000 mg | ORAL_CAPSULE | Freq: Every day | ORAL | Status: DC

## 2013-04-26 NOTE — Telephone Encounter (Signed)
refilled flomax and called the patient to inform

## 2013-04-26 NOTE — Telephone Encounter (Signed)
Ok for [place], but should f/u with urology as planned - to robin to handle

## 2013-04-26 NOTE — Telephone Encounter (Signed)
The patient was prescribed in the hospital flomax, but only got a #30 day supply.  He would like a refill and was not sure if PCP or urologist should fill, please advise

## 2013-04-27 ENCOUNTER — Telehealth: Payer: Self-pay

## 2013-04-27 NOTE — Telephone Encounter (Signed)
Faxed to [facility] clearance for surgery for this patient to [phone].

## 2013-06-07 ENCOUNTER — Other Ambulatory Visit (INDEPENDENT_AMBULATORY_CARE_PROVIDER_SITE_OTHER): Payer: Managed Care, Other (non HMO)

## 2013-06-07 DIAGNOSIS — Z0000 Encounter for general adult medical examination without abnormal findings: Secondary | ICD-10-CM

## 2013-06-07 LAB — HEPATIC FUNCTION PANEL
AST: 29 U/L (ref 0–37)
Albumin: 4.2 g/dL (ref 3.5–5.2)
Total Protein: 6.6 g/dL (ref 6.0–8.3)

## 2013-06-07 LAB — PSA: PSA: 0.74 ng/mL (ref 0.10–4.00)

## 2013-06-07 LAB — CBC WITH DIFFERENTIAL/PLATELET
Basophils Absolute: 0 10*3/uL (ref 0.0–0.1)
Basophils Relative: 0.4 % (ref 0.0–3.0)
Eosinophils Absolute: 0.3 10*3/uL (ref 0.0–0.7)
HCT: 45.1 % (ref 39.0–52.0)
Hemoglobin: 15.4 g/dL (ref 13.0–17.0)
Lymphs Abs: 1.1 10*3/uL (ref 0.7–4.0)
MCHC: 34.1 g/dL (ref 30.0–36.0)
Monocytes Relative: 9.9 % (ref 3.0–12.0)
Neutro Abs: 3.9 10*3/uL (ref 1.4–7.7)
RBC: 4.94 Mil/uL (ref 4.22–5.81)
RDW: 13.4 % (ref 11.5–14.6)

## 2013-06-07 LAB — URINALYSIS, ROUTINE W REFLEX MICROSCOPIC
Bilirubin Urine: NEGATIVE
Hgb urine dipstick: NEGATIVE
Leukocytes, UA: NEGATIVE
Nitrite: NEGATIVE

## 2013-06-07 LAB — BASIC METABOLIC PANEL
CO2: 27 mEq/L (ref 19–32)
Calcium: 9.4 mg/dL (ref 8.4–10.5)
Creatinine, Ser: 0.8 mg/dL (ref 0.4–1.5)
Sodium: 141 mEq/L (ref 135–145)

## 2013-06-07 LAB — LIPID PANEL
Cholesterol: 158 mg/dL (ref 0–200)
HDL: 45.1 mg/dL (ref >39.00)
Total CHOL/HDL Ratio: 4
Triglycerides: 67 mg/dL (ref 0.0–149.0)

## 2013-06-09 ENCOUNTER — Encounter (HOSPITAL_COMMUNITY): Payer: Self-pay | Admitting: Pharmacy Technician

## 2013-06-09 NOTE — Pre-Procedure Instructions (Signed)
JOREN REHM  06/09/2013   Your procedure is scheduled on:  June 16, 2013 at 8:30 AM  Report to [facility] at 6:30 AM.  Call this number if you have problems the morning of surgery: [phone]   Remember:   Do not eat food or drink liquids after midnight.   Take these medicines the morning of surgery with A SIP OF WATER: levothyroxine (SYNTHROID, LEVOTHROID), tamsulosin (FLOMAX), traMADol (ULTRAM)  Stop all Vitamins, Herbal medications, Aspirin, and Non-Steroidals (Naproxen, Anaprox, Aleve, Motrin, Ibuprofen) as of today, 06/10/13       Do not wear jewelry, make-up or nail polish.  Do not wear lotions, powders, or perfumes. You may wear deodorant.  Do not shave 48 hours prior to surgery. Men may shave face and neck.  Do not bring valuables to the hospital.  [facility] is not responsible                   for any belongings or valuables.  Contacts, dentures or bridgework may not be worn into surgery.  Leave suitcase in the car. After surgery it may be brought to your room.  For patients admitted to the hospital, checkout time is 11:00 AM the day of  discharge.     Special Instructions: Shower using CHG 2 nights before surgery and the night before surgery.  If you shower the day of surgery use CHG.  Use special wash - you have one bottle of CHG for all showers.  You should use approximately 1/3 of the bottle for each shower.   Please read over the following fact sheets that you were given: Pain Booklet, Coughing and Deep Breathing, Blood Transfusion Information, MRSA Information and Surgical Site Infection Prevention

## 2013-06-10 ENCOUNTER — Encounter (HOSPITAL_COMMUNITY)
Admission: RE | Admit: 2013-06-10 | Discharge: 2013-06-10 | Disposition: A | Discharge status: Home / Self Care | Payer: Managed Care, Other (non HMO) | Source: Ambulatory Visit | Attending: Orthopedic Surgery | Admitting: Orthopedic Surgery

## 2013-06-10 ENCOUNTER — Ambulatory Visit (HOSPITAL_COMMUNITY)
Admission: RE | Admit: 2013-06-10 | Discharge: 2013-06-10 | Disposition: A | Discharge status: Home / Self Care | Payer: Managed Care, Other (non HMO) | Source: Ambulatory Visit | Attending: Orthopedic Surgery | Admitting: Orthopedic Surgery

## 2013-06-10 ENCOUNTER — Encounter (HOSPITAL_COMMUNITY): Payer: Self-pay

## 2013-06-10 DIAGNOSIS — Z01812 Encounter for preprocedural laboratory examination: Secondary | ICD-10-CM | POA: Insufficient documentation

## 2013-06-10 DIAGNOSIS — M5144 Schmorl's nodes, thoracic region: Secondary | ICD-10-CM | POA: Insufficient documentation

## 2013-06-10 DIAGNOSIS — M51379 Other intervertebral disc degeneration, lumbosacral region without mention of lumbar back pain or lower extremity pain: Secondary | ICD-10-CM | POA: Insufficient documentation

## 2013-06-10 DIAGNOSIS — Z01818 Encounter for other preprocedural examination: Secondary | ICD-10-CM | POA: Insufficient documentation

## 2013-06-10 DIAGNOSIS — M5137 Other intervertebral disc degeneration, lumbosacral region: Secondary | ICD-10-CM | POA: Insufficient documentation

## 2013-06-10 HISTORY — DX: Peripheral vascular disease, unspecified: I73.9

## 2013-06-10 LAB — BASIC METABOLIC PANEL
CO2: 26 mEq/L (ref 19–32)
Calcium: 9.8 mg/dL (ref 8.4–10.5)
Creatinine, Ser: 0.77 mg/dL (ref 0.50–1.35)
Glucose, Bld: 104 mg/dL — ABNORMAL HIGH (ref 70–99)

## 2013-06-10 LAB — CBC
MCH: 31.7 pg (ref 26.0–34.0)
MCV: 90.2 fL (ref 78.0–100.0)
Platelets: 164 10*3/uL (ref 150–400)
RBC: 5.08 MIL/uL (ref 4.22–5.81)

## 2013-06-10 LAB — TYPE AND SCREEN: ABO/RH(D): A POS

## 2013-06-10 NOTE — Progress Notes (Signed)
Spoke wirh Dr. Shon Baton re: patient and spouse having question if he was having laminectomy/ decompression as well as fusion to lower back.  Dr. Shon Baton stated yes all was included in Transforaminal fusion L 4-5.

## 2013-06-10 NOTE — Progress Notes (Signed)
06/10/13 0856  OBSTRUCTIVE SLEEP APNEA  Have you ever been diagnosed with sleep apnea through a sleep study? No  Do you snore loudly (loud enough to be heard through closed doors)?  1  Do you often feel tired, fatigued, or sleepy during the daytime? 0  Has anyone observed you stop breathing during your sleep? 1  Do you have, or are you being treated for high blood pressure? 0  BMI more than 35 kg/m2? 0  Age over 65 years old? 1  Neck circumference greater than 40 cm/18 inches? 0  Gender: 1  Obstructive Sleep Apnea Score 4  Score 4 or greater  Results sent to PCP

## 2013-06-11 ENCOUNTER — Ambulatory Visit (INDEPENDENT_AMBULATORY_CARE_PROVIDER_SITE_OTHER): Payer: Managed Care, Other (non HMO) | Admitting: Internal Medicine

## 2013-06-11 ENCOUNTER — Encounter: Payer: Self-pay | Admitting: Internal Medicine

## 2013-06-11 VITALS — BP 122/80 | HR 77 | Temp 98.3°F | Ht 72.0 in | Wt 219.5 lb

## 2013-06-11 DIAGNOSIS — Z0000 Encounter for general adult medical examination without abnormal findings: Secondary | ICD-10-CM

## 2013-06-11 DIAGNOSIS — Z23 Encounter for immunization: Secondary | ICD-10-CM | POA: Diagnosis not present

## 2013-06-11 MED ORDER — LEVOTHYROXINE SODIUM 25 MCG PO TABS: 25.0000 ug | ORAL_TABLET | Freq: Every day | ORAL | Status: DC

## 2013-06-11 MED ORDER — TAMSULOSIN HCL 0.4 MG PO CAPS: 0.4000 mg | ORAL_CAPSULE | Freq: Every day | ORAL | Status: DC

## 2013-06-11 MED ORDER — PRAVASTATIN SODIUM 40 MG PO TABS: 40.0000 mg | ORAL_TABLET | Freq: Every day | ORAL | Status: DC

## 2013-06-11 NOTE — Addendum Note (Signed)
Addended by: Scharlene Gloss B on: 06/11/2013 09:15 AM   Modules accepted: Orders

## 2013-06-11 NOTE — Patient Instructions (Addendum)
You had the pneumonia shot today Please continue all other medications as before, and refills have been done if requested. Please continue your efforts at being more active, low cholesterol diet, and weight control. You are otherwise up to date with prevention measures today. Kevin Stein with your Surgury tomorrow You are given the copy of your lab work today Please keep your appointments with your specialists as you have planned  Please remember to sign up for My Chart if you have not done so, as this will be important to you in the future with finding out test results, communicating by private email, and scheduling acute appointments online when needed.  Please return in 1 year for your yearly visit, or sooner if needed

## 2013-06-11 NOTE — Progress Notes (Signed)
Subjective:    Patient ID: Kevin Stein, male    DOB: 12-17-1945, 67 y.o.   MRN: [id number]  HPI  Here for wellness and f/u;  Overall doing ok;  Pt denies CP, worsening SOB, DOE, wheezing, orthopnea, PND, worsening LE edema, palpitations, dizziness or syncope.  Pt denies neurological change such as new headache, facial or extremity weakness.  Pt denies polydipsia, polyuria, or low sugar symptoms. Pt states overall good compliance with treatment and medications, good tolerability, and has been trying to follow lower cholesterol diet.  Pt denies worsening depressive symptoms, suicidal ideation or panic. No fever, night sweats, wt loss, loss of appetite, or other constitutional symptoms.  Pt states good ability with ADL's, has low fall risk, home safety reviewed and adequate, no other significant changes in hearing or vision, and only occasionally active with exercise.  For fusion to lumbar tomorrow per Dr Shon Baton.  Has not been able to walk the dogs 3 miles per day recent. Plans to retire July 2015. No acute complaints Past Medical History  Diagnosis Date  . HYPOTHYROIDISM 07/06/2008    Qualifier: Diagnosis of  By: Jonny Ruiz MD, Len Blalock   . HYPERLIPIDEMIA 07/06/2008    Qualifier: Diagnosis of  By: Jonny Ruiz MD, Len Blalock   . ALLERGIC RHINITIS 07/06/2008    Qualifier: Diagnosis of  By: Jonny Ruiz MD, Len Blalock   . 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 DIVERTICULOSIS, COLON 07/06/2008    Qualifier: Diagnosis of  By: Jonny Ruiz MD, Len Blalock   . DEGENERATIVE JOINT DISEASE, RIGHT SHOULDER 07/06/2008    Qualifier: Diagnosis of  By: Jonny Ruiz MD, Len Blalock   . COLONIC POLYPS, HX OF 07/06/2008    Qualifier: Diagnosis of  By: Jonny Ruiz MD, Len Blalock   . BENIGN PROSTATIC HYPERTROPHY 07/06/2008    Qualifier: Diagnosis of  By: Jonny Ruiz MD, Len Blalock   . Peripheral vascular disease    Past Surgical History  Procedure Laterality Date  . Vasectomy    . Left middle ear surgury    . Right shoulder surgury   2010    Dr Ranell Patrick  . Varicose vein surgery    . Axillary lymph node dissection      left     1978     reports that he has never smoked. He has never used smokeless tobacco. He reports that  drinks alcohol. He reports that he does not use illicit drugs. family history includes Cancer in his father; Diabetes in his mother; Hypertension in his mother. No Known Allergies Current Outpatient Prescriptions on File Prior to Visit  Medication Sig Dispense Refill  . acetaminophen (TYLENOL) 325 MG tablet Take 975 mg by mouth every 6 (six) hours as needed for pain.      Marland Kitchen b complex vitamins tablet Take 1 tablet by mouth daily.      Marland Kitchen HYDROcodone-acetaminophen (NORCO/VICODIN) 5-325 MG per tablet Take 1 tablet by mouth every 4 (four) hours as needed for pain.      . Misc Natural Products (OSTEO BI-FLEX ADV DOUBLE ST PO) Take by mouth 2 (two) times daily.      . Multiple Vitamin (MULTIVITAMIN WITH MINERALS) TABS tablet Take 1 tablet by mouth daily.       No current facility-administered medications on file prior to visit.   Review of Systems Constitutional: Negative for diaphoresis, activity change, appetite change or unexpected weight change.  HENT: Negative for hearing loss, ear  pain, facial swelling, mouth sores and neck stiffness.   Eyes: Negative for pain, redness and visual disturbance.  Respiratory: Negative for shortness of breath and wheezing.   Cardiovascular: Negative for chest pain and palpitations.  Gastrointestinal: Negative for diarrhea, blood in stool, abdominal distention or other pain Genitourinary: Negative for hematuria, flank pain or change in urine volume.  Musculoskeletal: Negative for myalgias and joint swelling.  Skin: Negative for color change and wound.  Neurological: Negative for syncope and numbness. other than noted Hematological: Negative for adenopathy.  Psychiatric/Behavioral: Negative for hallucinations, self-injury, decreased concentration and agitation.       Objective:   Physical Exam BP 122/80  Pulse 77  Temp(Src) 98.3 F (36.8 C) (Oral)  Ht 6' (1.829 m)  Wt 219 lb 8 oz (99.565 kg)  BMI 29.76 kg/m2  SpO2 98% VS noted,  Constitutional: Pt is oriented to person, place, and time. Appears well-developed and well-nourished.  Head: Normocephalic and atraumatic.  Right Ear: External ear normal.  Left Ear: External ear normal.  Nose: Nose normal.  Mouth/Throat: Oropharynx is clear and moist.  Eyes: Conjunctivae and EOM are normal. Pupils are equal, round, and reactive to light.  Neck: Normal range of motion. Neck supple. No JVD present. No tracheal deviation present.  Cardiovascular: Normal rate, regular rhythm, normal heart sounds and intact distal pulses.   Pulmonary/Chest: Effort normal and breath sounds normal.  Abdominal: Soft. Bowel sounds are normal. There is no tenderness. No HSM  Musculoskeletal: Normal range of motion. Exhibits no edema.  Lymphadenopathy:  Has no cervical adenopathy.  Neurological: Pt is alert and oriented to person, place, and time. Pt has normal reflexes. No cranial nerve deficit. Motor 5/5, has bilat diminished patellar reflex trace only Skin: Skin is warm and dry. No rash noted.  Psychiatric:  Has  normal mood and affect. Behavior is normal.      Assessment & Plan:

## 2013-06-11 NOTE — Assessment & Plan Note (Signed)

## 2013-06-14 NOTE — H&P (Signed)
History of Present Illness  The patient is a 67 year old male who presents with back pain. The patient is here today in referral from Dr. Shelle Iron . The patient reports low back symptoms including pain which began 8 month(s) (+) ago without any known injury. and Symptoms include pain (lower lumbar radiating into bilat. lower ext. with the right greater than left to the level of the calf at times. Increased with standing. ), while symptoms do not include numbness, weakness, incontinence of stool or incontinence of urine. Current treatment includes nonsteroidal anti-inflammatory drugs (Advil/Aleve ), non-opioid analgesics (Tylenol ), opioid analgesics (Norco prn ) and activity modification. Past evaluation has included x-ray of the lumbar spine, CT of the lumbar spine (myelo. Patient is unable to do an MRI. ) and orthopedic evaluation. Past treatment has included Pred. Pak .    Allergies No Known Drug Allergies. 11/05/2011   Social History Tobacco use. never smoker Alcohol use. Never consumed alcohol. current drinker; drinks wine; only occasionally per week Children. 2 Illicit drug use. no Current work status. working full time Drug/Alcohol Rehab (Currently). no Most recent primary occupation. [employer] Marital status. married Living situation. live with spouse Tobacco / smoke exposure. no Previously in rehab. no Exercise. Exercises weekly; does running / walking Number of flights of stairs before winded. 4-5 Pain Contract. no   Medication History Norco (5-325MG  Tablet, 1 (one) Tablet Oral TAKE 1 TABLET EVERY 4 TO 6 HOURS AS NEEDED FOR PAIN., Taken starting 05/20/2013) Active. (BID TO TID) Flomax ( Oral) Specific dose unknown - Active. (QD) Levothyroxine Sodium ( Tablet, Oral) Active. (QD) Pravastatin Sodium (40MG  Tablet, Oral) Active. (QD)  Objective Transcription  He is a pleasant gentleman who appears his stated age in no acute distress.  He is alert and oriented times three. He is much younger than his stated age.  On examination, he has pain with forward flexion and extension of the lumbar spine. No obvious skin lesions, abrasions, contusions. No hip, knee or ankle pain with joint range of motion. Compartments are soft and nontender. Intact peripheral pulses. Negative Babinski's test. No clonus. Positive dysesthesias at the L4 distribution on the right side. Negative straight leg raise test.  Respiratory, no shortness of breath or chest pain.  Abdomen, soft and nontender.  No incontinence of bowel and bladder.  IMAGES:  The patient has a CT myelogram which was reviewed and shows a complete block at L4-5 with Grade 1 borderline 2 spondylolisthesis. There is severe central and lateral recessed stenosis and foraminal stenosis worse on the right. No significant contrast is seen below the L4-5 level but there is minimal facet hypertrophy at L5-S1, minimal disease at L2-3, L3-4.    Assessment & Plan Lumbar spinal stenosis (724.02) Current Plans l Follow up as needed  l Risks of surgery include, but are not limited to: Death, stroke, paralysis, nerve root damage/injury, bleeding, blood clots, loss of bowel/bladder control, sexual dysfunction, retrograde ejaculation, hardware failure, or malposition, spinal fluid leak, adjacent segment disease, non-union, need for further surgery, ongoing or worse pain, injury to bladder, bowel and abdominal contents, infection and recurrent disc herniation  l We have gone over the risks and benefits of surgery, which include infection, bleeding, nerve damage, death, stroke, paralysis, failure to heal, need for further surgery, ongoing or worse pain, loss of fixation, need for further surgery, CSF leak, loss of bowel or bladder control, ongoing or worse pain.   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  Plans Transcription  We reviewed treatment options including nonoperative and operative. Both the patient and his wife have expressed an understanding of the disease process as well as the surgical procedure. The risks of surgery include infection, bleeding, nerve damage, death, stroke, paralysis, failure to heal, need for further surgery, ongoing or worse pain, loss of bowel and bladder control, adjacent segment disease, hardware complications. At this point, the surgical procedure of choice would be a transforaminal lumbar interbody fusion L4-5. This would allow for direct neural decompression to address the neuropathic right leg pain and then stabilization from an interbody standpoint as well as posterior pedicle screw standpoint to prevent worsening of the spondylolisthesis.

## 2013-06-15 MED ORDER — CEFAZOLIN SODIUM-DEXTROSE 2-3 GM-% IV SOLR
2.0000 g | INTRAVENOUS | Status: AC
  Administered 2013-06-16: 2 g via INTRAVENOUS
  Filled 2013-06-15: qty 50

## 2013-06-16 ENCOUNTER — Inpatient Hospital Stay (HOSPITAL_COMMUNITY)
Admission: RE | Admit: 2013-06-16 | Discharge: 2013-06-18 | DRG: 460 | Disposition: A | Discharge status: Home / Self Care | Payer: Managed Care, Other (non HMO) | Source: Ambulatory Visit | Attending: Orthopedic Surgery | Admitting: Orthopedic Surgery

## 2013-06-16 ENCOUNTER — Inpatient Hospital Stay (HOSPITAL_COMMUNITY): Payer: Managed Care, Other (non HMO) | Admitting: Anesthesiology

## 2013-06-16 ENCOUNTER — Encounter (HOSPITAL_COMMUNITY)
Admission: RE | Disposition: A | Discharge status: Home / Self Care | Payer: Self-pay | Source: Ambulatory Visit | Attending: Orthopedic Surgery

## 2013-06-16 ENCOUNTER — Inpatient Hospital Stay (HOSPITAL_COMMUNITY): Payer: Managed Care, Other (non HMO)

## 2013-06-16 ENCOUNTER — Encounter (HOSPITAL_COMMUNITY): Payer: Self-pay | Admitting: *Deleted

## 2013-06-16 ENCOUNTER — Encounter (HOSPITAL_COMMUNITY): Payer: Self-pay | Admitting: Anesthesiology

## 2013-06-16 DIAGNOSIS — I739 Peripheral vascular disease, unspecified: Secondary | ICD-10-CM | POA: Diagnosis present

## 2013-06-16 DIAGNOSIS — M4310 Spondylolisthesis, site unspecified: Principal | ICD-10-CM | POA: Diagnosis present

## 2013-06-16 DIAGNOSIS — Z981 Arthrodesis status: Secondary | ICD-10-CM

## 2013-06-16 DIAGNOSIS — IMO0002 Reserved for concepts with insufficient information to code with codable children: Secondary | ICD-10-CM | POA: Diagnosis not present

## 2013-06-16 DIAGNOSIS — M5137 Other intervertebral disc degeneration, lumbosacral region: Secondary | ICD-10-CM | POA: Diagnosis present

## 2013-06-16 DIAGNOSIS — Y9210 Unspecified residential institution as the place of occurrence of the external cause: Secondary | ICD-10-CM | POA: Diagnosis not present

## 2013-06-16 DIAGNOSIS — M51379 Other intervertebral disc degeneration, lumbosacral region without mention of lumbar back pain or lower extremity pain: Secondary | ICD-10-CM | POA: Diagnosis present

## 2013-06-16 DIAGNOSIS — Z79899 Other long term (current) drug therapy: Secondary | ICD-10-CM

## 2013-06-16 DIAGNOSIS — E039 Hypothyroidism, unspecified: Secondary | ICD-10-CM | POA: Diagnosis present

## 2013-06-16 DIAGNOSIS — M539 Dorsopathy, unspecified: Secondary | ICD-10-CM | POA: Diagnosis not present

## 2013-06-16 DIAGNOSIS — G9741 Accidental puncture or laceration of dura during a procedure: Secondary | ICD-10-CM | POA: Diagnosis not present

## 2013-06-16 SURGERY — POSTERIOR LUMBAR FUSION 1 LEVEL
Anesthesia: General | Site: Spine Lumbar | Wound class: Clean

## 2013-06-16 MED ORDER — CEFAZOLIN SODIUM-DEXTROSE 2-3 GM-% IV SOLR
2.0000 g | INTRAVENOUS | Status: AC
  Administered 2013-06-16: 2 g via INTRAVENOUS
  Filled 2013-06-16: qty 50

## 2013-06-16 MED ORDER — LACTATED RINGERS IV SOLN
INTRAVENOUS | Status: DC | PRN
  Administered 2013-06-16: 09:00:00 via INTRAVENOUS

## 2013-06-16 MED ORDER — DEXAMETHASONE SODIUM PHOSPHATE 4 MG/ML IJ SOLN
4.0000 mg | Freq: Once | INTRAMUSCULAR | Status: AC
  Administered 2013-06-16: 10 mg via INTRAVENOUS

## 2013-06-16 MED ORDER — METHOCARBAMOL 500 MG PO TABS
500.0000 mg | ORAL_TABLET | Freq: Four times a day (QID) | ORAL | Status: DC | PRN
  Administered 2013-06-17 – 2013-06-18 (×5): 500 mg via ORAL
  Filled 2013-06-16 (×5): qty 1

## 2013-06-16 MED ORDER — DEXAMETHASONE SODIUM PHOSPHATE 4 MG/ML IJ SOLN
4.0000 mg | Freq: Four times a day (QID) | INTRAMUSCULAR | Status: DC
  Administered 2013-06-16 – 2013-06-17 (×3): 4 mg via INTRAVENOUS
  Filled 2013-06-16 (×11): qty 1

## 2013-06-16 MED ORDER — DIPHENHYDRAMINE HCL 12.5 MG/5ML PO ELIX: 12.5000 mg | ORAL_SOLUTION | Freq: Four times a day (QID) | ORAL | Status: DC | PRN

## 2013-06-16 MED ORDER — OXYCODONE HCL 5 MG/5ML PO SOLN: 5.0000 mg | Freq: Once | ORAL | Status: DC | PRN

## 2013-06-16 MED ORDER — MORPHINE SULFATE (PF) 1 MG/ML IV SOLN
INTRAVENOUS | Status: AC
  Filled 2013-06-16: qty 25

## 2013-06-16 MED ORDER — SODIUM CHLORIDE 0.9 % IJ SOLN: 9.0000 mL | INTRAMUSCULAR | Status: DC | PRN

## 2013-06-16 MED ORDER — DEXTROSE 5 % IV SOLN: 500.0000 mg | Freq: Four times a day (QID) | INTRAVENOUS | Status: DC | PRN

## 2013-06-16 MED ORDER — LEVOTHYROXINE SODIUM 25 MCG PO TABS
25.0000 ug | ORAL_TABLET | Freq: Every day | ORAL | Status: DC
  Administered 2013-06-17 – 2013-06-18 (×2): 25 ug via ORAL
  Filled 2013-06-16 (×3): qty 1

## 2013-06-16 MED ORDER — ARTIFICIAL TEARS OP OINT
TOPICAL_OINTMENT | OPHTHALMIC | Status: DC | PRN
  Administered 2013-06-16: 1 via OPHTHALMIC

## 2013-06-16 MED ORDER — HEMOSTATIC AGENTS (NO CHARGE) OPTIME
TOPICAL | Status: DC | PRN
  Administered 2013-06-16: 1 via TOPICAL

## 2013-06-16 MED ORDER — ONDANSETRON HCL 4 MG/2ML IJ SOLN
INTRAMUSCULAR | Status: DC | PRN
  Administered 2013-06-16: 4 mg via INTRAVENOUS

## 2013-06-16 MED ORDER — ONDANSETRON HCL 4 MG/2ML IJ SOLN: 4.0000 mg | Freq: Four times a day (QID) | INTRAMUSCULAR | Status: DC | PRN

## 2013-06-16 MED ORDER — LACTATED RINGERS IV SOLN
INTRAVENOUS | Status: DC
  Administered 2013-06-16: 23:00:00 via INTRAVENOUS

## 2013-06-16 MED ORDER — SUCCINYLCHOLINE CHLORIDE 20 MG/ML IJ SOLN
INTRAMUSCULAR | Status: DC | PRN
  Administered 2013-06-16: 100 mg via INTRAVENOUS

## 2013-06-16 MED ORDER — PROPOFOL 10 MG/ML IV BOLUS
INTRAVENOUS | Status: DC | PRN
  Administered 2013-06-16: 200 mg via INTRAVENOUS

## 2013-06-16 MED ORDER — LACTATED RINGERS IV SOLN
INTRAVENOUS | Status: DC | PRN
  Administered 2013-06-16 (×3): via INTRAVENOUS

## 2013-06-16 MED ORDER — FENTANYL CITRATE 0.05 MG/ML IJ SOLN: 50.0000 ug | Freq: Once | INTRAMUSCULAR | Status: DC

## 2013-06-16 MED ORDER — MENTHOL 3 MG MT LOZG: 1.0000 | LOZENGE | OROMUCOSAL | Status: DC | PRN

## 2013-06-16 MED ORDER — ONDANSETRON HCL 4 MG/2ML IJ SOLN: 4.0000 mg | INTRAMUSCULAR | Status: DC | PRN

## 2013-06-16 MED ORDER — BUPIVACAINE-EPINEPHRINE 0.25% -1:200000 IJ SOLN
INTRAMUSCULAR | Status: DC | PRN
  Administered 2013-06-16: 10 mL

## 2013-06-16 MED ORDER — FENTANYL CITRATE 0.05 MG/ML IJ SOLN
INTRAMUSCULAR | Status: DC | PRN
  Administered 2013-06-16 (×2): 100 ug via INTRAVENOUS
  Administered 2013-06-16: 50 ug via INTRAVENOUS
  Administered 2013-06-16: 100 ug via INTRAVENOUS
  Administered 2013-06-16 (×5): 50 ug via INTRAVENOUS

## 2013-06-16 MED ORDER — PROPOFOL INFUSION 10 MG/ML OPTIME
INTRAVENOUS | Status: DC | PRN
  Administered 2013-06-16: 10:00:00 via INTRAVENOUS
  Administered 2013-06-16: 75 ug/kg/min via INTRAVENOUS
  Administered 2013-06-16: 12:00:00 via INTRAVENOUS

## 2013-06-16 MED ORDER — ALBUMIN HUMAN 5 % IV SOLN
INTRAVENOUS | Status: DC | PRN
  Administered 2013-06-16: 13:00:00 via INTRAVENOUS

## 2013-06-16 MED ORDER — HYDROMORPHONE HCL PF 1 MG/ML IJ SOLN
INTRAMUSCULAR | Status: AC
  Filled 2013-06-16: qty 1

## 2013-06-16 MED ORDER — ACETAMINOPHEN 10 MG/ML IV SOLN
1000.0000 mg | Freq: Four times a day (QID) | INTRAVENOUS | Status: AC
  Administered 2013-06-16 – 2013-06-17 (×4): 1000 mg via INTRAVENOUS
  Filled 2013-06-16 (×4): qty 100

## 2013-06-16 MED ORDER — BUPIVACAINE-EPINEPHRINE PF 0.25-1:200000 % IJ SOLN
INTRAMUSCULAR | Status: AC
  Filled 2013-06-16: qty 30

## 2013-06-16 MED ORDER — CEFAZOLIN SODIUM 1-5 GM-% IV SOLN
1.0000 g | Freq: Three times a day (TID) | INTRAVENOUS | Status: AC
  Administered 2013-06-16 – 2013-06-17 (×2): 1 g via INTRAVENOUS
  Filled 2013-06-16 (×2): qty 50

## 2013-06-16 MED ORDER — THROMBIN 20000 UNITS EX SOLR
CUTANEOUS | Status: DC | PRN
  Administered 2013-06-16: 10:00:00 via TOPICAL

## 2013-06-16 MED ORDER — SIMVASTATIN 20 MG PO TABS
20.0000 mg | ORAL_TABLET | Freq: Every day | ORAL | Status: DC
  Administered 2013-06-16 – 2013-06-17 (×2): 20 mg via ORAL
  Filled 2013-06-16 (×3): qty 1

## 2013-06-16 MED ORDER — MORPHINE SULFATE (PF) 1 MG/ML IV SOLN
INTRAVENOUS | Status: DC
  Administered 2013-06-16: 13 mg via INTRAVENOUS
  Administered 2013-06-16: 1 mg via INTRAVENOUS
  Administered 2013-06-17 (×2): 2 mg via INTRAVENOUS

## 2013-06-16 MED ORDER — LIDOCAINE HCL (CARDIAC) 20 MG/ML IV SOLN
INTRAVENOUS | Status: DC | PRN
  Administered 2013-06-16: 100 mg via INTRAVENOUS

## 2013-06-16 MED ORDER — ACETAMINOPHEN 10 MG/ML IV SOLN
1000.0000 mg | Freq: Four times a day (QID) | INTRAVENOUS | Status: DC
  Administered 2013-06-16: 1000 mg via INTRAVENOUS
  Filled 2013-06-16: qty 100

## 2013-06-16 MED ORDER — THROMBIN 20000 UNITS EX SOLR: CUTANEOUS | Status: DC | PRN

## 2013-06-16 MED ORDER — MIDAZOLAM HCL 2 MG/2ML IJ SOLN: 1.0000 mg | INTRAMUSCULAR | Status: DC | PRN

## 2013-06-16 MED ORDER — 0.9 % SODIUM CHLORIDE (POUR BTL) OPTIME
TOPICAL | Status: DC | PRN
  Administered 2013-06-16: 1000 mL

## 2013-06-16 MED ORDER — DEXAMETHASONE 4 MG PO TABS
4.0000 mg | ORAL_TABLET | Freq: Four times a day (QID) | ORAL | Status: DC
  Administered 2013-06-17 – 2013-06-18 (×5): 4 mg via ORAL
  Filled 2013-06-16 (×11): qty 1

## 2013-06-16 MED ORDER — OXYCODONE HCL 5 MG PO TABS
10.0000 mg | ORAL_TABLET | ORAL | Status: DC | PRN
  Administered 2013-06-17 – 2013-06-18 (×8): 10 mg via ORAL
  Filled 2013-06-16 (×9): qty 2

## 2013-06-16 MED ORDER — MORPHINE SULFATE 2 MG/ML IJ SOLN: 1.0000 mg | INTRAMUSCULAR | Status: DC | PRN

## 2013-06-16 MED ORDER — THROMBIN 20000 UNITS EX SOLR
CUTANEOUS | Status: AC
  Filled 2013-06-16: qty 20000

## 2013-06-16 MED ORDER — MIDAZOLAM HCL 5 MG/5ML IJ SOLN
INTRAMUSCULAR | Status: DC | PRN
  Administered 2013-06-16: 2 mg via INTRAVENOUS

## 2013-06-16 MED ORDER — OXYCODONE HCL 5 MG PO TABS: 5.0000 mg | ORAL_TABLET | Freq: Once | ORAL | Status: DC | PRN

## 2013-06-16 MED ORDER — DIPHENHYDRAMINE HCL 50 MG/ML IJ SOLN: 12.5000 mg | Freq: Four times a day (QID) | INTRAMUSCULAR | Status: DC | PRN

## 2013-06-16 MED ORDER — SODIUM CHLORIDE 0.9 % IJ SOLN: 3.0000 mL | INTRAMUSCULAR | Status: DC | PRN

## 2013-06-16 MED ORDER — ZOLPIDEM TARTRATE 5 MG PO TABS: 5.0000 mg | ORAL_TABLET | Freq: Every evening | ORAL | Status: DC | PRN

## 2013-06-16 MED ORDER — HYDROMORPHONE HCL PF 1 MG/ML IJ SOLN
0.2500 mg | INTRAMUSCULAR | Status: DC | PRN
  Administered 2013-06-16: 0.5 mg via INTRAVENOUS

## 2013-06-16 MED ORDER — THROMBIN 20000 UNITS EX SOLR
CUTANEOUS | Status: DC | PRN
  Administered 2013-06-16: 20000 [IU] via TOPICAL

## 2013-06-16 MED ORDER — SODIUM CHLORIDE 0.9 % IJ SOLN: 3.0000 mL | Freq: Two times a day (BID) | INTRAMUSCULAR | Status: DC

## 2013-06-16 MED ORDER — TAMSULOSIN HCL 0.4 MG PO CAPS
0.4000 mg | ORAL_CAPSULE | Freq: Every day | ORAL | Status: DC
  Administered 2013-06-16 – 2013-06-17 (×2): 0.4 mg via ORAL
  Filled 2013-06-16 (×3): qty 1

## 2013-06-16 MED ORDER — NALOXONE HCL 0.4 MG/ML IJ SOLN: 0.4000 mg | INTRAMUSCULAR | Status: DC | PRN

## 2013-06-16 MED ORDER — SODIUM CHLORIDE 0.9 % IV SOLN: 250.0000 mL | INTRAVENOUS | Status: DC

## 2013-06-16 MED ORDER — PROMETHAZINE HCL 25 MG/ML IJ SOLN: 6.2500 mg | INTRAMUSCULAR | Status: DC | PRN

## 2013-06-16 MED ORDER — PHENOL 1.4 % MT LIQD: 1.0000 | OROMUCOSAL | Status: DC | PRN

## 2013-06-16 SURGICAL SUPPLY — 73 items
BLADE SURG ROTATE 9660 (MISCELLANEOUS) ×2 IMPLANT
BUR EGG ELITE 4.0 (BURR) ×2 IMPLANT
CLOTH BEACON ORANGE TIMEOUT ST (SAFETY) ×2 IMPLANT
CLSR STERI-STRIP ANTIMIC 1/2X4 (GAUZE/BANDAGES/DRESSINGS) ×4 IMPLANT
CORDS BIPOLAR (ELECTRODE) ×2 IMPLANT
COVER MAYO STAND STRL (DRAPES) ×4 IMPLANT
COVER SURGICAL LIGHT HANDLE (MISCELLANEOUS) ×2 IMPLANT
DERMABOND ADVANCED (GAUZE/BANDAGES/DRESSINGS)
DERMABOND ADVANCED .7 DNX12 (GAUZE/BANDAGES/DRESSINGS) IMPLANT
DRAPE C-ARM 42X72 X-RAY (DRAPES) ×2 IMPLANT
DRAPE INCISE IOBAN 66X45 STRL (DRAPES) ×2 IMPLANT
DRAPE ORTHO SPLIT 77X108 STRL (DRAPES) ×1
DRAPE POUCH INSTRU U-SHP 10X18 (DRAPES) ×2 IMPLANT
DRAPE SURG 17X23 STRL (DRAPES) ×2 IMPLANT
DRAPE SURG ORHT 6 SPLT 77X108 (DRAPES) ×1 IMPLANT
DRAPE U-SHAPE 47X51 STRL (DRAPES) ×2 IMPLANT
DRSG MEPILEX BORDER 4X8 (GAUZE/BANDAGES/DRESSINGS) ×4 IMPLANT
DURAFORM COLLAGEN 1X1 5-PACK (Neuro Prosthesis/Implant) ×2 IMPLANT
DURAPREP 26ML APPLICATOR (WOUND CARE) ×2 IMPLANT
ELECT BLADE 4.0 EZ CLEAN MEGAD (MISCELLANEOUS) ×2
ELECT BLADE 6.5 EXT (BLADE) ×2 IMPLANT
ELECT REM PT RETURN 9FT ADLT (ELECTROSURGICAL) ×2
ELECTRODE BLDE 4.0 EZ CLN MEGD (MISCELLANEOUS) ×1 IMPLANT
ELECTRODE REM PT RTRN 9FT ADLT (ELECTROSURGICAL) ×1 IMPLANT
GLOVE BIOGEL PI IND STRL 6.5 (GLOVE) IMPLANT
GLOVE BIOGEL PI IND STRL 8.5 (GLOVE) ×1 IMPLANT
GLOVE BIOGEL PI INDICATOR 6.5 (GLOVE)
GLOVE BIOGEL PI INDICATOR 8.5 (GLOVE) ×1
GLOVE ECLIPSE 6.0 STRL STRAW (GLOVE) IMPLANT
GLOVE ECLIPSE 8.5 STRL (GLOVE) ×2 IMPLANT
GOWN PREVENTION PLUS XXLARGE (GOWN DISPOSABLE) ×2 IMPLANT
GOWN STRL NON-REIN LRG LVL3 (GOWN DISPOSABLE) ×4 IMPLANT
GUIDEWIRE BLUNT VIPER II 1.45 (WIRE) ×8 IMPLANT
IV CATH 14GX2 1/4 (CATHETERS) IMPLANT
KIT BASIN OR (CUSTOM PROCEDURE TRAY) ×2 IMPLANT
KIT ORACLE NEUROMONITING (KITS) ×2 IMPLANT
KIT POSITION SURG JACKSON T1 (MISCELLANEOUS) ×2 IMPLANT
KIT ROOM TURNOVER OR (KITS) ×2 IMPLANT
NEEDLE 22X1 1/2 (OR ONLY) (NEEDLE) ×2 IMPLANT
NEEDLE I-PASS III (NEEDLE) ×2 IMPLANT
NEEDLE SPNL 18GX3.5 QUINCKE PK (NEEDLE) ×2 IMPLANT
NEURO MONITORING STIM (LABOR (TRAVEL & OVERTIME)) ×2 IMPLANT
NS IRRIG 1000ML POUR BTL (IV SOLUTION) ×2 IMPLANT
PACK LAMINECTOMY ORTHO (CUSTOM PROCEDURE TRAY) ×2 IMPLANT
PACK UNIVERSAL I (CUSTOM PROCEDURE TRAY) ×2 IMPLANT
PAD ARMBOARD 7.5X6 YLW CONV (MISCELLANEOUS) ×4 IMPLANT
PATTIES SURGICAL .5 X.5 (GAUZE/BANDAGES/DRESSINGS) ×2 IMPLANT
PATTIES SURGICAL .5 X1 (DISPOSABLE) ×2 IMPLANT
ROD PRE LORDOSED VIPER 5.5X50 (Rod) ×2 IMPLANT
ROD VIPER2 5.5X45 PRE LARDOSED (Rod) ×2 IMPLANT
SCREW SET SINGLE INNER MIS (Screw) ×8 IMPLANT
SCREW XTAB POLY VIPER  6X45 (Screw) ×4 IMPLANT
SCREW XTAB POLY VIPER 6X45 (Screw) ×4 IMPLANT
SHEET CONFORM 45LX20WX5H (Bone Implant) ×2 IMPLANT
SPONGE LAP 4X18 X RAY DECT (DISPOSABLE) ×6 IMPLANT
SPONGE SURGIFOAM ABS GEL 100 (HEMOSTASIS) ×2 IMPLANT
STRIP CLOSURE SKIN 1/2X4 (GAUZE/BANDAGES/DRESSINGS) ×2 IMPLANT
SURGIFLO TRUKIT (HEMOSTASIS) ×2 IMPLANT
SUT MNCRL AB 3-0 PS2 18 (SUTURE) ×4 IMPLANT
SUT PROLENE 6 0 PC 1 (SUTURE) ×2 IMPLANT
SUT VIC AB 1 CT1 27 (SUTURE) ×2
SUT VIC AB 1 CT1 27XBRD ANBCTR (SUTURE) ×2 IMPLANT
SUT VIC AB 2-0 CT1 18 (SUTURE) ×4 IMPLANT
SUT VICRYL 0 UR6 27IN ABS (SUTURE) ×4 IMPLANT
SYR BULB IRRIGATION 50ML (SYRINGE) ×2 IMPLANT
SYR CONTROL 10ML LL (SYRINGE) ×2 IMPLANT
TAP CANN VIPER2 DL 5.0 (TAP) ×2 IMPLANT
TLIF XLRG 11MM (Neuro Prosthesis/Implant) ×2 IMPLANT
TOWEL OR 17X24 6PK STRL BLUE (TOWEL DISPOSABLE) ×2 IMPLANT
TOWEL OR 17X26 10 PK STRL BLUE (TOWEL DISPOSABLE) ×2 IMPLANT
TRAY FOLEY CATH 16FRSI W/METER (SET/KITS/TRAYS/PACK) ×2 IMPLANT
WATER STERILE IRR 1000ML POUR (IV SOLUTION) IMPLANT
YANKAUER SUCT BULB TIP NO VENT (SUCTIONS) ×2 IMPLANT

## 2013-06-16 NOTE — H&P (Signed)
No change in clinical exam H+P Reviewed

## 2013-06-16 NOTE — Anesthesia Preprocedure Evaluation (Addendum)
Anesthesia Evaluation  Patient identified by MRN, date of birth, ID band Patient awake    Reviewed: Allergy & Precautions, H&P , NPO status , Patient's Chart, lab work & pertinent test results  History of Anesthesia Complications Negative for: history of anesthetic complications  Airway Mallampati: II TM Distance: >3 FB Neck ROM: Full    Dental  (+) Teeth Intact and Dental Advisory Given   Pulmonary neg pulmonary ROS,  breath sounds clear to auscultation        Cardiovascular + Peripheral Vascular Disease Rhythm:Regular Rate:Normal     Neuro/Psych  Neuromuscular disease    GI/Hepatic negative GI ROS, Neg liver ROS,   Endo/Other  Hypothyroidism   Renal/GU      Musculoskeletal   Abdominal   Peds  Hematology negative hematology ROS (+)   Anesthesia Other Findings   Reproductive/Obstetrics negative OB ROS                          Anesthesia Physical Anesthesia Plan  ASA: II  Anesthesia Plan: General   Post-op Pain Management:    Induction: Intravenous  Airway Management Planned: Oral ETT  Additional Equipment:   Intra-op Plan:   Post-operative Plan: Extubation in OR  Informed Consent: I have reviewed the patients History and Physical, chart, labs and discussed the procedure including the risks, benefits and alternatives for the proposed anesthesia with the patient or authorized representative who has indicated his/her understanding and acceptance.     Plan Discussed with: CRNA and Surgeon  Anesthesia Plan Comments:         Anesthesia Quick Evaluation

## 2013-06-16 NOTE — Anesthesia Postprocedure Evaluation (Signed)
Anesthesia Post Note  Patient: Kevin Stein  Procedure(s) Performed: Procedure(s) (LRB): TLIF L4-L5   (1 LEVEL) (N/A)  Anesthesia type: General  Patient location: PACU  Post pain: Pain level controlled and Adequate analgesia  Post assessment: Post-op Vital signs reviewed, Patient's Cardiovascular Status Stable, Respiratory Function Stable, Patent Airway and Pain level controlled  Last Vitals:  Filed Vitals:   06/16/13 1604  BP:   Pulse:   Temp: 36.4 C  Resp:     Post vital signs: Reviewed and stable  Level of consciousness: awake, alert  and oriented  Complications: No apparent anesthesia complications

## 2013-06-16 NOTE — Brief Op Note (Signed)
06/16/2013  2:08 PM  PATIENT:  Kevin Stein  67 y.o. 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   PROCEDURE:  Procedure(s): TLIF L4-L5   (1 LEVEL) (N/A)  SURGEON:  Surgeon(s) and Role:    * Venita Lick, MD - Primary  PHYSICIAN ASSISTANT:   ASSISTANTS: Lestine Mount   ANESTHESIA:   general  EBL:  Total I/O In: 3000 [I.V.:2750; IV Piggyback:250] Out: 755 [Urine:455; Blood:300]  BLOOD ADMINISTERED:none  DRAINS: none   LOCAL MEDICATIONS USED:  MARCAINE     SPECIMEN:  No Specimen  DISPOSITION OF SPECIMEN:  N/A  COUNTS:  YES  TOURNIQUET:  * No tourniquets in log *  DICTATION: .Other Dictation: Dictation Number [id number]  PLAN OF CARE: Admit to inpatient   PATIENT DISPOSITION:  PACU - hemodynamically stable.

## 2013-06-16 NOTE — Transfer of Care (Signed)
Immediate Anesthesia Transfer of Care Note  Patient: Kevin Stein  Procedure(s) Performed: Procedure(s): TLIF L4-L5   (1 LEVEL) (N/A)  Patient Location: PACU  Anesthesia Type:General  Level of Consciousness: sedated and patient cooperative  Airway & Oxygen Therapy: Patient Spontanous Breathing and Patient connected to face mask oxygen  Post-op Assessment: Report given to PACU RN and Post -op Vital signs reviewed and stable  Post vital signs: Reviewed and stable  Complications: No apparent anesthesia complications

## 2013-06-17 MED ORDER — MORPHINE SULFATE 2 MG/ML IJ SOLN
2.0000 mg | INTRAMUSCULAR | Status: DC | PRN
  Administered 2013-06-17 (×2): 2 mg via INTRAVENOUS
  Filled 2013-06-17: qty 1

## 2013-06-17 MED ORDER — MORPHINE SULFATE 2 MG/ML IJ SOLN
INTRAMUSCULAR | Status: AC
  Filled 2013-06-17: qty 1

## 2013-06-17 MED ORDER — DOCUSATE SODIUM 100 MG PO CAPS
100.0000 mg | ORAL_CAPSULE | Freq: Two times a day (BID) | ORAL | Status: DC
  Administered 2013-06-17 – 2013-06-18 (×2): 100 mg via ORAL
  Filled 2013-06-17 (×3): qty 1

## 2013-06-17 NOTE — Progress Notes (Signed)
Utilization review completed. Sarely Stracener, RN, BSN. 

## 2013-06-17 NOTE — Progress Notes (Signed)
OT Cancellation Note  Patient Details Name: Kevin Stein MRN: [id number] DOB: Feb 23, 1946 11:00 AM  Cancelled Treatment:     Orders received for OT. Per RN, request hold pt until later in afternoon. Will check back as able.  Roselie Awkward Dixon 06/17/2013, 1:13 pm

## 2013-06-17 NOTE — Progress Notes (Addendum)
Subjective: Patient doing well.  Pain 4/10. Denies headache, lightheadedness or LE numbness, tingling.     Objective: Vital signs in last 24 hours: Temp:  [97 F (36.1 C)-98.4 F (36.9 C)] 98.1 F (36.7 C) (08/28 0941) Pulse Rate:  [72-96] 72 (08/28 0941) Resp:  [9-26] 18 (08/28 0941) BP: (126-177)/(65-91) 126/65 mmHg (08/28 0941) SpO2:  [94 %-100 %] 98 % (08/28 0941)  Intake/Output from previous day: 08/27 0701 - 08/28 0700 In: 3200 [I.V.:2950; IV Piggyback:250] Out: 4075 [Urine:3775; Blood:300] Intake/Output this shift:    No results found for this basename: HGB,  in the last 72 hours No results found for this basename: WBC, RBC, HCT, PLT,  in the last 72 hours No results found for this basename: NA, K, CL, CO2, BUN, CREATININE, GLUCOSE, CALCIUM,  in the last 72 hours No results found for this basename: LABPT, INR,  in the last 72 hours  Exam:  Dressing C/D/I.  bilat calves NT.  NVI.  bilat ehl, ant tib, and gastroc are strong.    Assessment/Plan: Up with PT today.  Will start slowly due to dural tear/repair.  Anticipate d/c home Friday or Saturday.  D/c morphine.     Stein,Kevin M 06/17/2013, 10:00 AM     Patient doing well Ambulating No headache NVI Plan on d/c in AM after PT

## 2013-06-17 NOTE — Evaluation (Addendum)
Physical Therapy Evaluation Patient Details Name: Kevin Stein MRN: [id number] DOB: 1946/05/25 Today's Date: 06/17/2013 Time: 5621-3086 PT Time Calculation (min): 19 min  PT Assessment / Plan / Recommendation      Clinical Impression  Pt is a 67 y.o. male s/p TLIF L4-L5 and dural tear repair surgery resulting in functional limitations due to the deficits listed below (see PT Problem List).  Patient will benefit from skilled PT to increase their independence and safety with mobility to allow discharge to the venue listed below. Pt moving well and is highly motivated to D/C home with family when medically stable. Will determine HHPT needs closer to D/C time.      PT Assessment  Patient needs continued PT services    Follow Up Recommendations  Supervision/Assistance - 24 hour;Supervision for mobility/OOB;Home health PT;Other (comment) (pending rehab progress; may not need HHPT upon D/C)    Does the patient have the potential to tolerate intense rehabilitation      Barriers to Discharge   none    Equipment Recommendations  3in1 (PT)    Recommendations for Other Services OT consult   Frequency Min 5X/week    Precautions / Restrictions Precautions Precautions: Back;Fall Precaution Booklet Issued: Yes (comment) Precaution Comments: given back preacutions handout and reviewed back precautins with pt and family thoroughly Required Braces or Orthoses: Spinal Brace Spinal Brace: Lumbar corset;Applied in sitting position Restrictions Weight Bearing Restrictions: No   Pertinent Vitals/Pain 6/10 RN provided medication to assist with pain control       Mobility  Bed Mobility Bed Mobility: Rolling Left;Left Sidelying to Sit Rolling Left: 5: Supervision;With rail Left Sidelying to Sit: 5: Supervision;HOB flat;With rails Details for Bed Mobility Assistance: cues for log rolling technique and to adhere to back precautions; no physical (A) needed with cues and hand rails   Transfers Transfers: Sit to Stand;Stand to Sit Sit to Stand: 5: Supervision;From bed;From elevated surface Stand to Sit: 5: Supervision;To chair/3-in-1;With armrests Details for Transfer Assistance: cues to adhere to back precautions and for safety; no physical (A) needed; supervision for safety and cues Ambulation/Gait Ambulation/Gait Assistance: 4: Min guard Ambulation Distance (Feet): 140 Feet Assistive device: 1 person hand held assist Ambulation/Gait Assistance Details: pt required +1 HHA to steady; pt reports he feels "wobbly"; no LOB noted; pt with short shuffled gt; encouraged to increase BOS and stride length; cues to adhere to back precautions with turns  Gait Pattern: Decreased stride length;Narrow base of support Gait velocity: decreased due to pain and fear Stairs: No Wheelchair Mobility Wheelchair Mobility: No    Exercises General Exercises - Lower Extremity Ankle Circles/Pumps: AROM;Both;10 reps;Supine   PT Diagnosis: Acute pain;Abnormality of gait  PT Problem List: Decreased balance;Decreased mobility;Pain;Decreased knowledge of precautions PT Treatment Interventions: DME instruction;Stair training;Gait training;Functional mobility training;Therapeutic activities;Therapeutic exercise;Balance training;Neuromuscular re-education;Patient/family education     PT Goals(Current goals can be found in the care plan section) Acute Rehab PT Goals Patient Stated Goal: to get my strength back PT Goal Formulation: With patient/family Time For Goal Achievement: 06/24/13 Potential to Achieve Goals: Good  Visit Information  Last PT Received On: 06/17/13 Assistance Needed: +1       Prior Functioning  Home Living Family/patient expects to be discharged to:: Private residence Living Arrangements: Spouse/significant other Available Help at Discharge: Family;Available 24 hours/day Type of Home: House Home Access: Stairs to enter [employer] of Steps: 3 Entrance  Stairs-Rails: None Home Layout: One level Home Equipment: Shower seat;Other (comment) (has reacher) Additional Comments: has walk in  shower and standard toilet seat height  Prior Function Level of Independence: Independent Comments: pt works to Psychologist, 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 Dominant Hand: Left    Cognition  Cognition Arousal/Alertness: Awake/alert Behavior During Therapy: WFL for tasks assessed/performed Overall Cognitive Status: Within Functional Limits for tasks assessed    Extremity/Trunk Assessment Upper Extremity Assessment Upper Extremity Assessment: Defer to OT evaluation (pt reports having Rt shoulder repair) Lower Extremity Assessment Lower Extremity Assessment: Overall WFL for tasks assessed Cervical / Trunk Assessment Cervical / Trunk Assessment: Normal   Balance Balance Balance Assessed: Yes Static Sitting Balance Static Sitting - Balance Support: No upper extremity supported;Feet supported Static Sitting - Level of Assistance: 5: Stand by assistance  End of Session PT - End of Session Equipment Utilized During Treatment: Back brace Activity Tolerance: Patient tolerated treatment well Patient left: in chair;with call bell/phone within reach;with nursing/sitter in room;with family/visitor present Nurse Communication: Mobility status  GP     Donell Sievert, [place] [phone] 06/17/2013, 3:43 PM

## 2013-06-17 NOTE — Op Note (Signed)
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  OPERATIVE PROCEDURE: 1. Gill decompression L4-5 right side. 2. Complete diskectomy L4-5 with implantation of biomechanical     intervertebral device. 3. Posterior segmental instrumentation for fusion L4-5 with pedicle     screws. 4. Posterolateral arthrodesis L4-5 with local bone autograft and     allograft. 5. Repair of iatrogenic dural tear.  INTRAOPERATIVE FINDINGS:  The patient had severe spinal stenosis.  An inadvertent dural tear when removing the spicule of bone.  The dura expanded and struck a sharp piece of bone and caused a small dural tear. This was repaired with 6-0 Prolene, as well as a dural patch applied at the end of the procedure with a 40 mm Valsalva.  There was no CSF leak and the patient had an watertight seal.  Intraoperative instrumentation used was DePuy Viper pedicle screw system, MIS pedicle screws 45 mm length of screws, 6-0 diameter with a 45 mm rod on the right and a 50 mm rod on the left with tightened titanium intervertebral cage, size 11 x 35.  HISTORY:  This is a very pleasant gentleman who has been complaining of severe back, buttock, and right leg pain for sometime.  Attempts at conservative management had failed to alleviate his symptoms.  As a result, we elected to proceed with surgery.  All appropriate risks, benefits, and alternatives were discussed with the patient and consent was  obtained.  FIRST ASSISTANT:  Genene Churn. Barry Dienes, PA-C  OPERATIVE NOTE:  The patient was brought to the operating room, placed supine on the operating table.  After successful induction of general anesthesia and endotracheal intubation, TEDs, SCDs, and a Foley were inserted.  The neuro monitoring representative from neural stem then applied all appropriate monitoring needles.  The patient was turned prone onto the Wilson frame and all bony prominences were well padded. The back was prepped and draped in a standard fashion.  Time-out was taken to confirm patient, procedure, and all other pertinent important data.  Once the time-out was done, since he was having more symptomatic right hand side, I elected to do this as __________ approach side.  On the left side, a small stab incision was made just down the lateral side of the pedicle.  Jamshidi needle was advanced percutaneously to the junction of the transverse process and the facet complex.  I confirmed trajectory in position with fluoroscopic guidance.  I attached the neuromonitoring device to the Jamshidi needle and advanced the Jamshidi needle into the L4 pedicle.  I confirmed trajectory in position in both the AP and lateral planes.  Once it was at the appropriate depth, I placed a guide pin through the Jamshidi needle and then repeated the  process at L5.  Once both L4-L5 pedicles were cannulated on the left side, I then enlarged the incision somewhat and then placed a tap and tapped over the guide pin.  I then placed a 45 mm length pedicle screw at both levels.  I then stimulated both screws and both screws demonstrated no evidence of breach and no evidence of nerve irritation. At this point, I was pleased with the position of these screws.  I then went to the right-hand side and made a single incision along the lateral aspect of the L4-L5 pedicle to perform a standard Wiltse approach.  This incision was about 2 inches in length.   Sharp dissection was carried out down to the deep fascia.  Deep fascia was sharply __________ mobilized the paraspinal muscles to expose the L4-5 interspace.  I identified the 3-4, 4-5 facet complexes and then removed the facet capsule at the L4-5 level.  I then placed a Jamshidi needle, and using the same technique, I had used on the other side, I cannulated the L4-L5 pedicles.  At this time, I did not elect to place the screws at this time.  Then, guide pins were folded away and I placed a self-retaining retractor.  I then used an osteotome to remove the inferior L4 facet in its entirety. There was significant arthritic changes.  I then identified the L4 pars and began resecting this.  There was significant overgrowth and osteophyte.  I then developed a plane underneath the L4 lamina, and using a 2 and 3 mm Kerrison, resected the lamina.  It was at this point that I noted there was significant central stenosis as I would expect based on the preoperative CT myelogram.  After removing a small piece of bone, I noticed there was a sharp spicule and the thecal sac actually expanded and there was a small bleb created.  I then protected this with a neural patty, and then continued my dissection.  A near subtotal laminectomy of L4 was completed and I removed the remaining ligamentum flavum.  I identified the L4 nerve root and removed the remaining portion of the pars working from medial to lateral using Kerrison punches.  I then worked inferiorly and identified the L5 nerve root and removed all the overhanging osteophyte of the superior L5 facet.  At this point, I could see the superior aspect of the L5 facet and the inferior aspect of the L4 facet.  I was pleased with my decompression. I then coagulated the epidural veins and was retracted the thecal sac and protected the 4 and 5 nerve root with neural patties.  By now an excellent visualization of the posterolateral corner of the disk.  A  15-blade scalpel was used to incise the disk and then using a combination of pituitary rongeurs, and various curettes and Kerrison rongeurs, and pituitary rongeurs, I removed all of the disk material. Care was taken not to plunge anteriorly and causing vascular injury. Once I was able to feel that I was scraping the long endplates, I sequentially trialed and elected to use the 11 x 35 spacer.  This was obtained and packed with local bone from the autograft from the decompression.  I then took a strip of __________ graft, which was essentially allograft, hydrated with blood from the wound and then placed a strip along the anterior annulus.  I then protected the thecal sac and malleted the graft to the appropriate resting place.  I was able to get it anterior and almost  or completely horizontal.  I was pleased with my positioning of the cage.  It was not compressing or irritating either the L4 or L5 nerve root or the thecal sac centrally.  At this point, I irrigated the wound copiously with normal saline.  I then turned my attention to the CSF leak.  I then used a 6-0 Prolene and then did a running closure with approximately 3 sutures.  I then stitched a piece of dura patch over top of it.  I then Valsalva'd the patient and there was no CSF leak.  At this point, with the dural tear dealt with and a decompression complete, I irrigated the wound again copiously with normal saline and made sure of hemostasis using bipolar electrocautery. I then tapped over the guide pins that I had placed into the pedicles of 4 and 5, and then placed the same size screws into these levels.  I stimulated both screws and again there was no abnormal signals to suggest breach.  Radiographically, the screws had excellent trajectory. I then measured and placed a rod and then secured it according to manufacturers standards.  All the top nuts were torqued down.  I then removed the tabs and then took final x-rays.  The  hardware was in good position.  The reduction was satisfactory and the graft was in good position.  At this point, I then irrigated both wounds, closed the deep fascia with interrupted #1 Vicryl sutures, superficial with 2-0 Vicryl sutures and a 3-0 Monocryl for the subcutaneous.  Steri-Strips and dry dressing were applied.  The patient was extubated and transferred to the PACU without incident.  In the PACU, the patient will remain flat or in slight Trendelenburg given the fact that there was a CSF leak even though he had a watertight seal.  At the end of the case, all needle and sponge counts were correct.     Alvy Beal, MD     DDB/MEDQ  D:  06/16/2013  T:  06/16/2013  Job:  [id number]

## 2013-06-18 ENCOUNTER — Encounter (HOSPITAL_COMMUNITY): Payer: Self-pay | Admitting: General Practice

## 2013-06-18 LAB — URINE CULTURE
Colony Count: NO GROWTH
Culture: NO GROWTH

## 2013-06-18 MED ORDER — OXYCODONE-ACETAMINOPHEN 10-325 MG PO TABS: 1.0000 | ORAL_TABLET | ORAL | Status: DC | PRN

## 2013-06-18 MED ORDER — METHOCARBAMOL 500 MG PO TABS: 500.0000 mg | ORAL_TABLET | Freq: Four times a day (QID) | ORAL | Status: DC | PRN

## 2013-06-18 NOTE — Evaluation (Signed)
Occupational Therapy Evaluation and Discharge Summary Patient Details Name: Kevin Stein MRN: [id number] DOB: 1946/04/14 Today's Date: 06/18/2013 Time: 7829-5621 OT Time Calculation (min): 23 min  OT Assessment / Plan / Recommendation History of present illness Pt is a 67 yo male admitted for TLIF at L4-L5 with dural tear repair.  Pt with shooting pain down leg that has now resolved.   Clinical Impression   Pt seen for adls for recent back surgery.  All education complete and pt not in need of further OT.    OT Assessment  Patient does not need any further OT services    Follow Up Recommendations  No OT follow up    Barriers to Discharge      Equipment Recommendations  3 in 1 bedside comode    Recommendations for Other Services    Frequency       Precautions / Restrictions Precautions Precautions: Back Precaution Booklet Issued: Yes (comment) Precaution Comments: pt able to recall 3/3 back precautions Required Braces or Orthoses: Spinal Brace Spinal Brace: Lumbar corset;Applied in sitting position Restrictions Weight Bearing Restrictions: No   Pertinent Vitals/Pain Pt c/o 3/10 pain in back.    ADL  Eating/Feeding: Performed;Independent Where Assessed - Eating/Feeding: Chair Grooming: Performed;Wash/dry face;Wash/dry hands;Supervision/safety Where Assessed - Grooming: Unsupported standing Upper Body Bathing: Simulated;Set up Where Assessed - Upper Body Bathing: Unsupported sitting Lower Body Bathing: Performed;Minimal assistance Where Assessed - Lower Body Bathing: Unsupported sit to stand Upper Body Dressing: Performed;Set up Where Assessed - Upper Body Dressing: Unsupported sitting Lower Body Dressing: Performed;Minimal assistance Where Assessed - Lower Body Dressing: Unsupported sit to stand Toilet Transfer: Performed;Supervision/safety Toilet Transfer Method:  (ambulating) Acupuncturist: Comfort height toilet;Grab bars Toileting - Clothing  Manipulation and Hygiene: Performed;Supervision/safety Where Assessed - Engineer, mining and Hygiene: Standing Equipment Used: Back brace Transfers/Ambulation Related to ADLs: Pt walked in room w/o assistive device. ADL Comments: Pt educated at length about the need to follow back precautions.  Pt cannot cross legs to donn socks and shoes.  Rec he use sock aid or use a small step stool to prop his foot on so he is not bending so much from his back.    OT Diagnosis:    OT Problem List:   OT Treatment Interventions:     OT Goals(Current goals can be found in the care plan section) Acute Rehab OT Goals Patient Stated Goal: to get my strength back  Visit Information  Last OT Received On: 06/18/13 Assistance Needed: +1 History of Present Illness: Pt is a 67 yo male admitted for TLIF at L4-L5 with dural tear repair.  Pt with shooting pain down leg that has now resolved.       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 Communication Communication: No difficulties Dominant Hand: Left         Vision/Perception     Cognition  Cognition Arousal/Alertness: Awake/alert Behavior During Therapy: WFL for tasks assessed/performed Overall Cognitive Status: Within Functional Limits for tasks assessed    Extremity/Trunk Assessment Upper Extremity Assessment Upper Extremity Assessment: Overall WFL for tasks assessed  Lower Extremity Assessment Lower Extremity Assessment:  Defer to PT evaluation Cervical / Trunk Assessment Cervical / Trunk Assessment: Normal     Mobility Bed Mobility Bed Mobility: Not assessed Details for Bed Mobility Assistance: discussed log rolling technique and home management ideas with pt; pt states he does use handrails and plans to have support at home to (A) upper body with log rolling technique; discussed importance of log rolling technique with pt and family; pt verbalized  understanding  Transfers Transfers: Sit to Stand;Stand to Sit Sit to Stand: 6: Modified independent (Device/Increase time);With armrests;From chair/3-in-1 Stand to Sit: 6: Modified independent (Device/Increase time);To chair/3-in-1;With armrests Details for Transfer Assistance: pt demo good technique with transfers and ability to maintain back precautions; pt relies on arm rests; instructed to have firm arm rests on chairs when sitting at home; pt verbalized understanding      Exercise     Balance Balance Balance Assessed: No   End of Session OT - End of Session Equipment Utilized During Treatment: Back brace Activity Tolerance: Patient tolerated treatment well Patient left: in chair;with call bell/phone within reach;with family/visitor present Nurse Communication: Mobility status  GO     Hope Budds 06/18/2013, 11:30 AM [phone]

## 2013-06-18 NOTE — Progress Notes (Signed)
Physical Therapy Treatment Patient Details Name: Kevin Stein MRN: [id number] DOB: [birth year] Today's Date: 06/18/2013 Time: 9563-8756 PT Time Calculation (min): 15 min  PT Assessment / Plan / Recommendation  History of Present Illness     PT Comments   Patient is making good progress with PT.  From a mobility standpoint anticipate patient will be ready for DC home today.  All education and goals completed at this time. Pt with great support at home; no HHPT needs at this time.      Follow Up Recommendations  Supervision - Intermittent;Supervision for mobility/OOB     Does the patient have the potential to tolerate intense rehabilitation     Barriers to Discharge        Equipment Recommendations  3in1 (PT)    Recommendations for Other Services OT consult  Frequency Min 5X/week   Progress towards PT Goals Progress towards PT goals: Goals met/education completed, patient discharged from PT  Plan Current plan remains appropriate    Precautions / Restrictions Precautions Precautions: Back Precaution Comments: pt able to recall 3/3 back precautions Required Braces or Orthoses: Spinal Brace Spinal Brace: Lumbar corset;Applied in sitting position Restrictions Weight Bearing Restrictions: No   Pertinent Vitals/Pain "just sore" RN provided medication to assist with pain control     Mobility  Bed Mobility Bed Mobility: Not assessed Details for Bed Mobility Assistance: discussed log rolling technique and home management ideas with pt; pt states he does use handrails and plans to have support at home to (A) upper body with log rolling technique; discussed importance of log rolling technique with pt and family; pt verbalized  understanding  Transfers Transfers: Sit to Stand;Stand to Sit Sit to Stand: 6: Modified independent (Device/Increase time);With armrests;From chair/3-in-1 Stand to Sit: 6: Modified independent (Device/Increase time);To chair/3-in-1;With armrests Details  for Transfer Assistance: pt demo good technique with transfers and ability to maintain back precautions; pt relies on arm rests; instructed to have firm arm rests on chairs when sitting at home; pt verbalized understanding  Ambulation/Gait Ambulation/Gait Assistance: 5: Supervision Ambulation Distance (Feet): 250 Feet Assistive device: None Ambulation/Gait Assistance Details: pt more steady today with gt; no (A) needed; supervision for min cues for safety to adhere to back precautions; pt gt pattern becoming more WFL  Gait Pattern: Step-through pattern Gait velocity: beginning to increase  Stairs: Yes Stairs Assistance: 5: Supervision Stairs Assistance Details (indicate cue type and reason): supervision for safety and min cues for gt sequencing  Stair Management Technique: No rails;Forwards;Step to pattern Number of Stairs: 2 Wheelchair Mobility Wheelchair Mobility: No         PT Diagnosis:    PT Problem List:   PT Treatment Interventions:     PT Goals (current goals can now be found in the care plan section) Acute Rehab PT Goals Patient Stated Goal: to get my strength back PT Goal Formulation: With patient/family Time For Goal Achievement: 06/24/13 Potential to Achieve Goals: Good  Visit Information  Last PT Received On: 06/18/13 Assistance Needed: +1    Subjective Data  Subjective: pt sitting in chair; "ive been getting up on my own. Its getting better"  Patient Stated Goal: to get my strength back   Cognition  Cognition Arousal/Alertness: Awake/alert Behavior During Therapy: WFL for tasks assessed/performed Overall Cognitive Status: Within Functional Limits for tasks assessed    Balance  Balance Balance Assessed: No  End of Session PT - End of Session Equipment Utilized During Treatment: Back brace Activity Tolerance: Patient tolerated treatment well  Patient left: in chair;with call bell/phone within reach;with family/visitor present Nurse Communication: Mobility  status   GP     Donell Sievert, [place] [phone] 06/18/2013, 8:22 AM

## 2013-06-18 NOTE — Progress Notes (Signed)
Subjective: Patient doing very well.  Some back discomfort.  Denies headache and LE radicular symptoms.  amblulated well with PT yesterday.  Ready to go home.     Objective: Vital signs in last 24 hours: Temp:  [97.6 F (36.4 C)-98.1 F (36.7 C)] 97.8 F (36.6 C) (08/29 0550) Pulse Rate:  [72-79] 77 (08/29 0550) Resp:  [18] 18 (08/29 0550) BP: (118-126)/(61-71) 118/70 mmHg (08/29 0550) SpO2:  [98 %-99 %] 98 % (08/29 0550)  Intake/Output from previous day: 08/28 0701 - 08/29 0700 In: 1560 [P.O.:480; I.V.:680; IV Piggyback:400] Out: 800 [Urine:800] Intake/Output this shift:    No results found for this basename: HGB,  in the last 72 hours No results found for this basename: WBC, RBC, HCT, PLT,  in the last 72 hours No results found for this basename: NA, K, CL, CO2, BUN, CREATININE, GLUCOSE, CALCIUM,  in the last 72 hours No results found for this basename: LABPT, INR,  in the last 72 hours  Neurologically intact Sensation intact distally Dorsiflexion/Plantar flexion intact, wound looks good.  No drainage or signs of infection.    Assessment/Plan: D/c home today.  F/u in office 2 weeks postop Scripts for percocet 10/325 and robaxin on chart.     Kevin Stein 06/18/2013, 8:22 AM

## 2013-06-18 NOTE — Discharge Summary (Signed)
Physician Discharge Summary  Patient ID: Kevin Stein MRN: [id number] DOB/AGE: [birth year] 65 y.o.  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  Had a iatrogenic dural tear which was repaired.  Tolerated surgery well.  Transferred to ortho unit.  28 aug, patient doing well.  No complaints of headache.  Pain controlled. Neurologically intact.  29 aug, doing well.  Did great with PT.  Ambulating without issues.  Pain controlled.  Wound looks good, steris intact.  No drainage or signs of infection.  Neurologically intact.   Ready to go home.   Consults: None  Discharge Exam: Blood pressure 118/70, pulse 77, temperature 97.8 F (36.6 C), temperature source Oral, resp. rate 18, SpO2 98.00%.   Disposition: 01-Home or Self Care  Discharge Orders   Future Orders Complete By Expires   Call MD / Call 911  As directed    Comments:     If you experience chest pain or shortness of breath, CALL 911 and be transported to the hospital emergency room.  If you develope a fever above 101 F, pus (white drainage) or increased drainage or redness at the wound, or calf pain, call your surgeon's office.   Constipation Prevention  As directed    Comments:     Drink plenty of fluids.  Prune juice may be helpful.  You may use a stool softener, such as Colace (over the counter) 100 mg twice a day.  Use MiraLax (over the counter) for constipation as needed.   Diet - low sodium heart healthy  As directed    Discharge instructions  As directed    Comments:     Ok to shower 5 days postop.  No tub soaking.  No aggressive activity.  No lifting or excessive bending at the waist.  Can get over the counter stool softener.   Driving restrictions  As directed     Comments:     No driving until further notice.   Increase activity slowly as tolerated  As directed    Lifting restrictions  As directed    Comments:     No lifting until further notice.       Medication List    STOP taking these medications       acetaminophen 325 MG tablet  Commonly known as:  TYLENOL     HYDROcodone-acetaminophen 5-325 MG per tablet  Commonly known as:  NORCO/VICODIN     multivitamin with minerals Tabs tablet     OSTEO BI-FLEX ADV DOUBLE ST PO      TAKE these medications       b complex vitamins tablet  Take 1 tablet by mouth daily.     levothyroxine 25 MCG tablet  Commonly known as:  SYNTHROID, LEVOTHROID  Take 1 tablet (25 mcg total) by mouth daily before breakfast.     methocarbamol 500 MG tablet  Commonly known as:  ROBAXIN  Take 1 tablet (500 mg total) by mouth every 6 (six) hours as needed (spasm).     oxyCODONE-acetaminophen 10-325 MG per tablet  Commonly known as:  PERCOCET  Take 1-2 tablets by mouth every 4 (four) hours as needed for pain.     pravastatin 40 MG tablet  Commonly known as:  PRAVACHOL  Take  1 tablet (40 mg total) by mouth daily.     tamsulosin 0.4 MG Caps capsule  Commonly known as:  FLOMAX  Take 1 capsule (0.4 mg total) by mouth daily after supper.           Follow-up Information   Schedule an appointment as soon as possible for a visit with Alvy Beal, MD. (need return office visit 2 weeks postop)    Specialty:  Orthopedic Surgery   Contact information:   [address] [place] [address] [phone]       Signed: Naida Sleight 06/18/2013, 11:04 AM

## 2013-06-18 NOTE — Progress Notes (Signed)
06/18/13 PT recommended 3N1, spoke with patient, he is agreeable to a 3N1.Spoke with Kipp Brood with [employer], 3N1 to be delivered to patient prior to d/c. No other discharge needs identified.  Jacquelynn Cree RN, BSN, CCM

## 2013-07-06 NOTE — Progress Notes (Signed)
Late entry: SW received a consult for possible placement. No PT follow up. . Clinical Social Worker will sign off for now as social work intervention is no longer needed. Please consult [place] again if new need arises.   Sabino Niemann, MSW [phone]

## 2013-08-26 ENCOUNTER — Other Ambulatory Visit: Payer: Self-pay

## 2014-06-07 ENCOUNTER — Other Ambulatory Visit: Payer: Self-pay | Admitting: *Deleted

## 2014-06-07 MED ORDER — PRAVASTATIN SODIUM 40 MG PO TABS: 40.0000 mg | ORAL_TABLET | Freq: Every day | ORAL | Status: DC

## 2014-06-07 MED ORDER — TAMSULOSIN HCL 0.4 MG PO CAPS: 0.4000 mg | ORAL_CAPSULE | Freq: Every day | ORAL | Status: DC

## 2014-06-07 MED ORDER — LEVOTHYROXINE SODIUM 25 MCG PO TABS: 25.0000 ug | ORAL_TABLET | Freq: Every day | ORAL | Status: DC

## 2014-06-07 MED ORDER — METHOCARBAMOL 500 MG PO TABS: 500.0000 mg | ORAL_TABLET | Freq: Four times a day (QID) | ORAL | Status: DC | PRN

## 2014-06-07 NOTE — Telephone Encounter (Signed)
Patient is requesting scripts to be sent to new pharmacy at [facility] order ([facility]).  Levothyroxine, methocarbamol, pravastatin and tamsulosin.

## 2014-06-07 NOTE — Telephone Encounter (Signed)
rx done erx for robaxin

## 2014-06-07 NOTE — Telephone Encounter (Signed)
Left msg on triage requesting call back. Called pt back no answer LMOM RTC.../lmb 

## 2014-06-08 NOTE — Telephone Encounter (Signed)
Spoke with pt wife on yesterday 06/07/14 pt has cpx on 06/29/14 he has enough meds now. Inform wife whrn he come in for cpx md will he able to send to [facility]...Johny Chess

## 2014-06-26 ENCOUNTER — Other Ambulatory Visit: Payer: Self-pay | Admitting: Internal Medicine

## 2014-06-26 IMAGING — CR DG MYELOGRAM LUMBAR
13 of 23 series · 13 of 23 positions shown · IV contrast (omnipaque)
Comparison: None.

CLINICAL DATA: Low back pain extending into the right lower
extremity.  The patient is unable to undergo MRI secondary to the
stapes implant.

MYELOGRAM INJECTION
TECHNIQUE: Informed consent was obtained from the patient prior to
the procedure, including potential complications of headache,
allergy, infection and pain.  A timeout procedure was performed.
With the patient prone, the lower back was prepped with Betadine.
1% Lidocaine was used for local anesthesia.  Lumbar puncture was
performed at the left paramidline L2-3 level using a 22 gauge
needle with return of clear CSF.  15 ml of Omnipaque 780was
injected into the subarachnoid space .
TECHNIQUE: I personally performed the lumbar puncture and
administered the intrathecal contrast. I also personally supervised
acquisition of the myelogram images. Following injection of
intrathecal Omnipaque contrast, spine imaging in multiple
projections was performed using fluoroscopy.
Fluoroscopy Time: 0 minutes 59 seconds
TECHNIQUE: CT imaging of the lumbar spine was performed after
intrathecal contrast administration.  Multiplanar CT image
reconstructions were also generated.

[[hospital]]
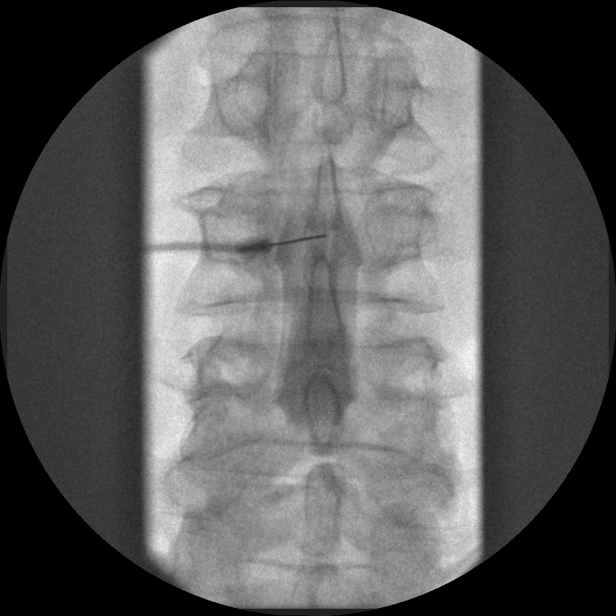

[myelogram  white (1 of 9)]
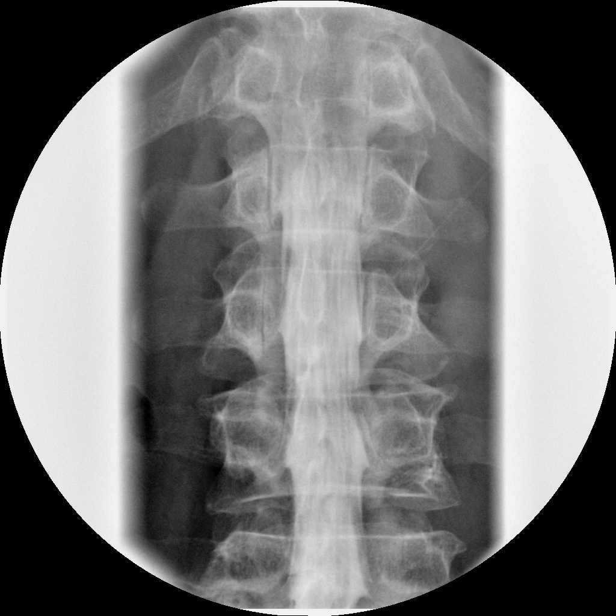

[myelogram  white (2 of 9)]
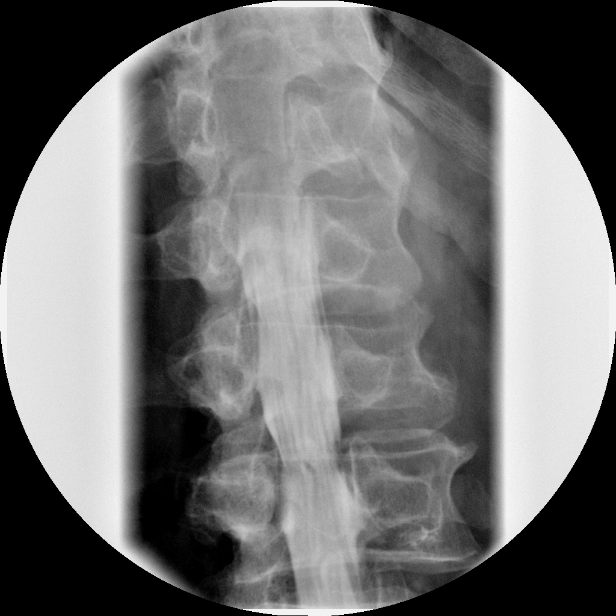

[myelogram  white (3 of 9)]
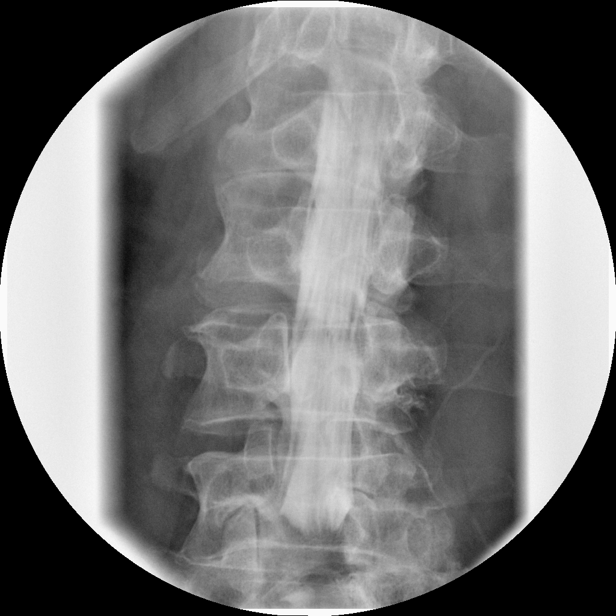

[myelogram  white (4 of 9)]
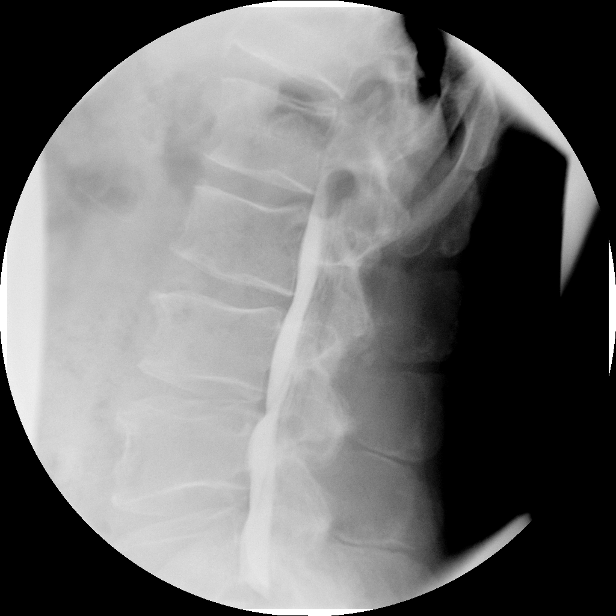

[myelogram  white (5 of 9)]
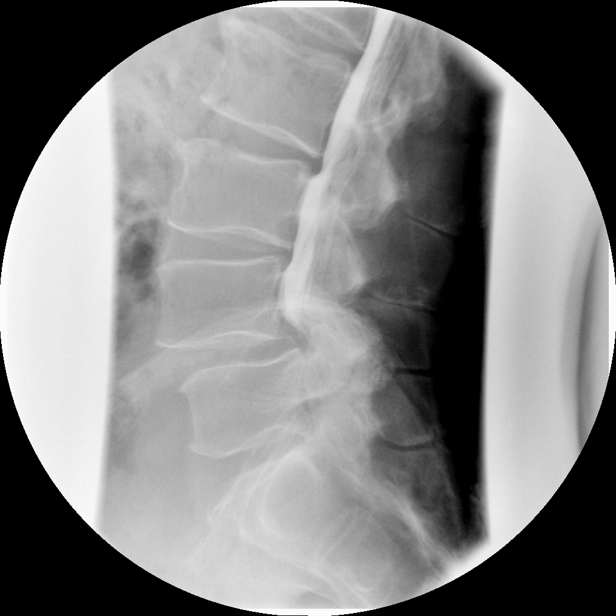

[myelogram  white (6 of 9)]
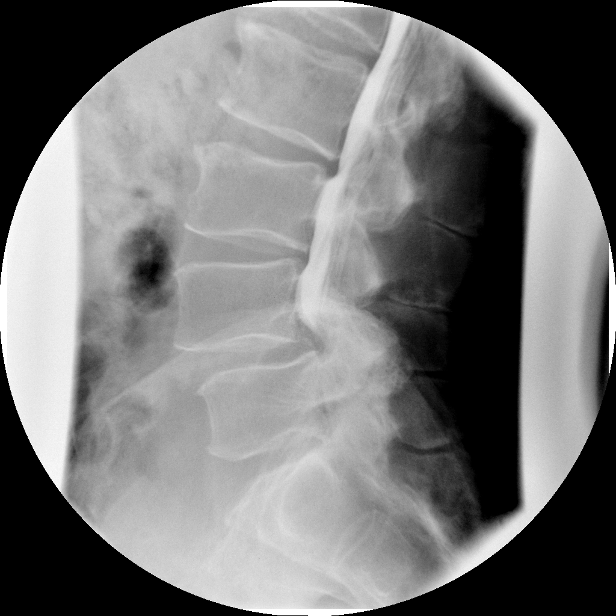

[myelogram  white (7 of 9)]
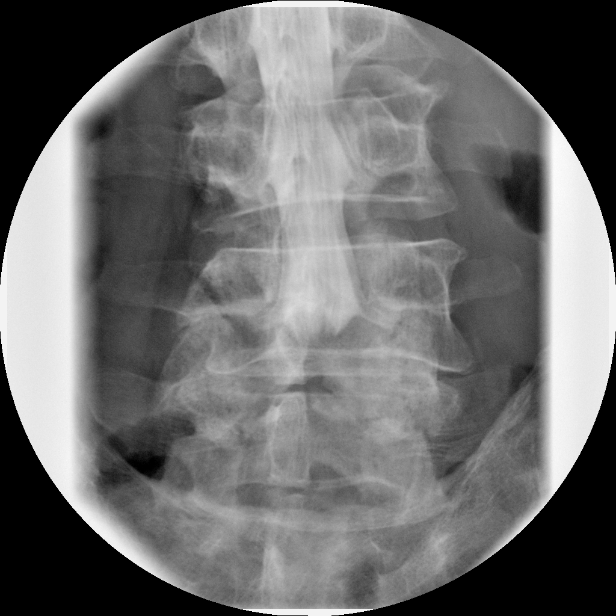

[myelogram  white (8 of 9)]
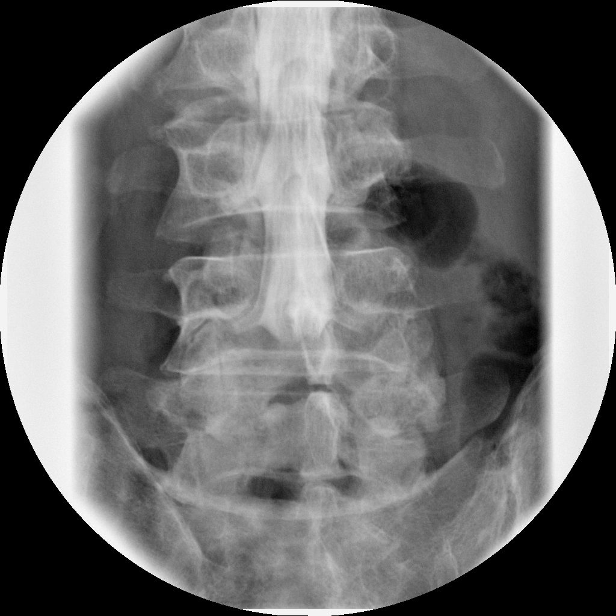

[myelogram  white (9 of 9)]
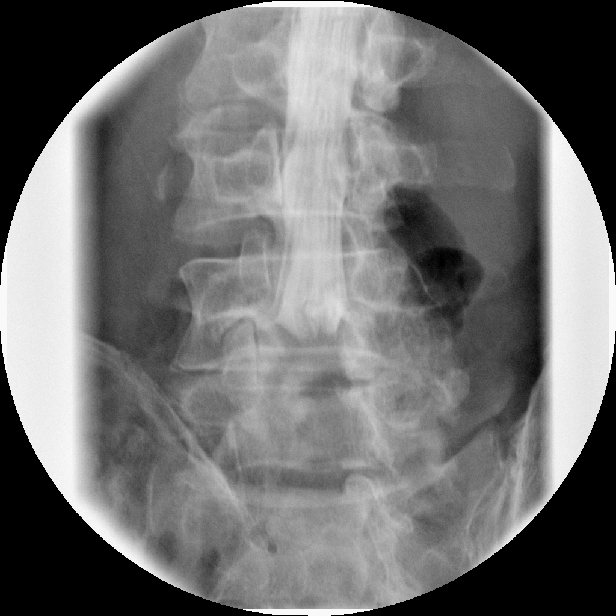

[view not recorded (1 of 3)]
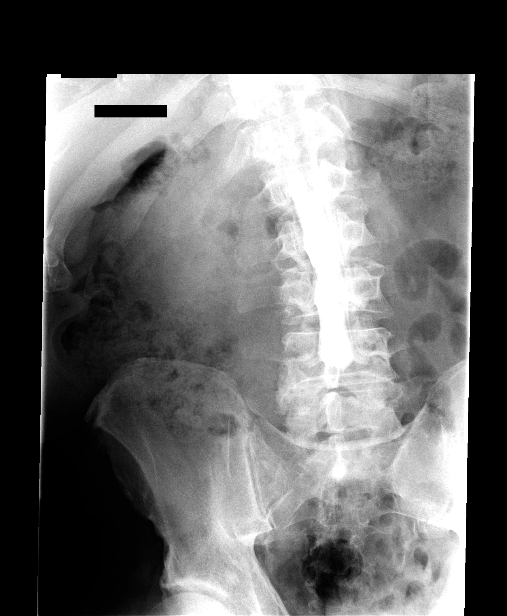

[view not recorded (2 of 3)]
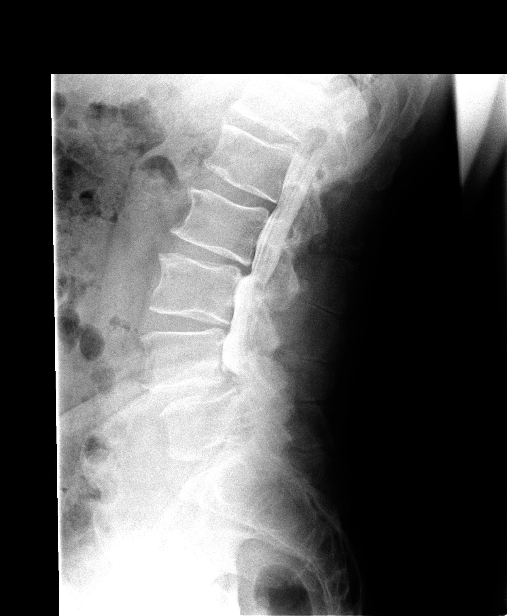

[view not recorded (3 of 3)]
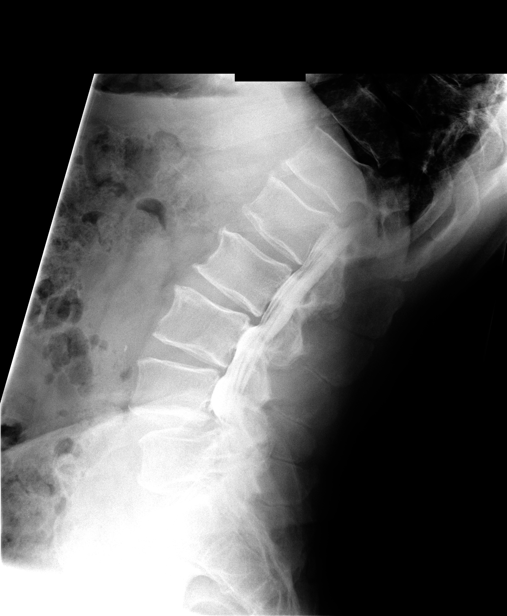

[13 of 23 positions shown; findings below may reference images not displayed]

IMPRESSION: Successful injection of  intrathecal contrast for myelography.

MYELOGRAM LUMBAR
FINDINGS: Mild disc bulging is present at L2-3 and L3-4 with slight
retrolisthesis of both levels.  Grade 1 anterolisthesis is present
at L4-5.  The L4 nerve roots exit.  There is a near complete block
of contrast centrally at the L4-5 level.  Minimal contrast is noted
at the sacral segments.

The retrolisthesis at L2-3 is slightly worse upon standing.  The
disc herniation at that level is worse as well.  L3-4 is stable.
There is no significant change in anterolisthesis at L4-5 upon
standing.  However, there is some exacerbation of the
anterolisthesis with flexion.
IMPRESSION: 1.  Dynamic anterolisthesis at L4-5 with a nearly complete block of
contrast suggesting severe central canal stenosis.
2.  Mild broad-based disc herniation at L2-3 is slightly worse upon
standing.
3.  Slight retrolisthesis at L2-3 and L3-4 does not change
significantly with standing or flexion/extension.
4.  Mild disc bulging and L3-4 without significant stenosis.


CT MYELOGRAPHY LUMBAR SPINE
FINDINGS: The lumbar spine is imaged from T11-12 through S3-4.
The slight degenerative retrolisthesis is present at L2-3 and L3-4.
Anterolisthesis at L4-5 measures 9 mm in the midline.

A prominent Schmorl's node is noted along the inferior endplate of
L3.

Limited imaging of the abdomen is unremarkable.

L1-2:  Minimal disc bulging is present.  Moderate facet hypertrophy
is noted.  There is no significant stenosis.

L2-3:  A mild broad-based disc herniation is asymmetric to the
right.  Moderate facet hypertrophy is noted bilaterally.  This
results in mild lateral recess and foraminal narrowing bilaterally,
worse on the right.

L3-4:  A leftward disc herniation is present.  Mild facet
hypertrophy is noted.  This results in mild left lateral recess and
bilateral foraminal stenosis.

L4-5:  9 mm anterolisthesis is present.  There is uncovering of a
broad-based disc herniation.  In concert with severe facet
hypertrophy and spurring, there is severe central canal stenosis.
Moderate foraminal stenosis is worse on the right.

L5-S1:  Minimal facet hypertrophy is evident.  There is no
significant stenosis.  No significant contrast material is seen
below the L4-5 level.
IMPRESSION: 1.  Broad-based disc herniation and advanced facet hypertrophy at
L4-5 results in severe central canal stenosis with a near complete
block of myelographic contrast at this level.
2.  Moderate foraminal stenosis at L4-5 is worse on the right.
3.  Mild lateral recess and foraminal narrowing bilaterally at L2-3
is worse on the right.
4.  Slight retrolisthesis at L2-3 and L3-4.
5.  Mild left lateral recess and bilateral foraminal narrowing at
L3-4.

## 2014-06-28 ENCOUNTER — Telehealth: Payer: Self-pay | Admitting: *Deleted

## 2014-06-28 NOTE — Telephone Encounter (Signed)
Left msg on triage stating have cpx 07/15/14 wanting to come do labs prior. Called pt back no answer LMOM due to medicare will have to see md prior then he will send to do labs after....Johny Chess

## 2014-07-04 ENCOUNTER — Other Ambulatory Visit: Payer: Self-pay | Admitting: Internal Medicine

## 2014-07-15 ENCOUNTER — Encounter: Payer: Self-pay | Admitting: Internal Medicine

## 2014-07-15 ENCOUNTER — Ambulatory Visit (INDEPENDENT_AMBULATORY_CARE_PROVIDER_SITE_OTHER): Payer: Medicare Other | Admitting: Internal Medicine

## 2014-07-15 ENCOUNTER — Other Ambulatory Visit (INDEPENDENT_AMBULATORY_CARE_PROVIDER_SITE_OTHER): Payer: Medicare Other

## 2014-07-15 VITALS — BP 130/88 | HR 73 | Temp 97.7°F | Ht 72.0 in | Wt 224.8 lb

## 2014-07-15 DIAGNOSIS — E039 Hypothyroidism, unspecified: Secondary | ICD-10-CM | POA: Diagnosis not present

## 2014-07-15 DIAGNOSIS — N320 Bladder-neck obstruction: Secondary | ICD-10-CM

## 2014-07-15 DIAGNOSIS — Z23 Encounter for immunization: Secondary | ICD-10-CM

## 2014-07-15 DIAGNOSIS — E785 Hyperlipidemia, unspecified: Secondary | ICD-10-CM

## 2014-07-15 DIAGNOSIS — J309 Allergic rhinitis, unspecified: Secondary | ICD-10-CM

## 2014-07-15 LAB — CBC WITH DIFFERENTIAL/PLATELET
BASOS ABS: 0 10*3/uL (ref 0.0–0.1)
Basophils Relative: 0.4 % (ref 0.0–3.0)
EOS ABS: 0.2 10*3/uL (ref 0.0–0.7)
Eosinophils Relative: 2.9 % (ref 0.0–5.0)
HEMATOCRIT: 47.6 % (ref 39.0–52.0)
Hemoglobin: 16.2 g/dL (ref 13.0–17.0)
LYMPHS ABS: 1.3 10*3/uL (ref 0.7–4.0)
Lymphocytes Relative: 18.1 % (ref 12.0–46.0)
MCHC: 34 g/dL (ref 30.0–36.0)
MCV: 91.7 fl (ref 78.0–100.0)
MONO ABS: 0.6 10*3/uL (ref 0.1–1.0)
MONOS PCT: 8.7 % (ref 3.0–12.0)
Neutro Abs: 5.2 10*3/uL (ref 1.4–7.7)
Neutrophils Relative %: 69.9 % (ref 43.0–77.0)
PLATELETS: 178 10*3/uL (ref 150.0–400.0)
RBC: 5.19 Mil/uL (ref 4.22–5.81)
RDW: 13.4 % (ref 11.5–15.5)
WBC: 7.4 10*3/uL (ref 4.0–10.5)

## 2014-07-15 LAB — LIPID PANEL
CHOLESTEROL: 178 mg/dL (ref 0–200)
HDL: 44.4 mg/dL (ref >39.00)
LDL Cholesterol: 116 mg/dL — ABNORMAL HIGH (ref 0–99)
NonHDL: 133.6
Total CHOL/HDL Ratio: 4
Triglycerides: 87 mg/dL (ref 0.0–149.0)
VLDL: 17.4 mg/dL (ref 0.0–40.0)

## 2014-07-15 LAB — BASIC METABOLIC PANEL
BUN: 19 mg/dL (ref 6–23)
CO2: 27 meq/L (ref 19–32)
Calcium: 9.7 mg/dL (ref 8.4–10.5)
Chloride: 105 mEq/L (ref 96–112)
Creatinine, Ser: 1 mg/dL (ref 0.4–1.5)
GFR: 79.42 mL/min (ref >60.00)
GLUCOSE: 94 mg/dL (ref 70–99)
POTASSIUM: 4.7 meq/L (ref 3.5–5.1)
SODIUM: 137 meq/L (ref 135–145)

## 2014-07-15 LAB — URINALYSIS, ROUTINE W REFLEX MICROSCOPIC
BILIRUBIN URINE: NEGATIVE
HGB URINE DIPSTICK: NEGATIVE
LEUKOCYTES UA: NEGATIVE
Nitrite: NEGATIVE
Total Protein, Urine: NEGATIVE
URINE GLUCOSE: NEGATIVE
UROBILINOGEN UA: 0.2 (ref 0.0–1.0)
pH: 5.5 (ref 5.0–8.0)

## 2014-07-15 LAB — TSH: TSH: 4.25 u[IU]/mL (ref 0.35–4.50)

## 2014-07-15 LAB — HEPATIC FUNCTION PANEL
ALBUMIN: 4.5 g/dL (ref 3.5–5.2)
ALK PHOS: 56 U/L (ref 39–117)
ALT: 24 U/L (ref 0–53)
AST: 26 U/L (ref 0–37)
BILIRUBIN DIRECT: 0.1 mg/dL (ref 0.0–0.3)
Total Bilirubin: 0.9 mg/dL (ref 0.2–1.2)
Total Protein: 7 g/dL (ref 6.0–8.3)

## 2014-07-15 LAB — PSA: PSA: 0.46 ng/mL (ref 0.10–4.00)

## 2014-07-15 MED ORDER — FLUTICASONE PROPIONATE 50 MCG/ACT NA SUSP: 2.0000 | Freq: Every day | NASAL | Status: DC

## 2014-07-15 MED ORDER — PRAVASTATIN SODIUM 40 MG PO TABS: 40.0000 mg | ORAL_TABLET | Freq: Every day | ORAL | Status: DC

## 2014-07-15 MED ORDER — TAMSULOSIN HCL 0.4 MG PO CAPS: ORAL_CAPSULE | ORAL | Status: DC

## 2014-07-15 MED ORDER — LEVOTHYROXINE SODIUM 25 MCG PO TABS: 25.0000 ug | ORAL_TABLET | Freq: Every day | ORAL | Status: DC

## 2014-07-15 NOTE — Assessment & Plan Note (Signed)
OK for flonase asd,  to f/u any worsening symptoms or concerns

## 2014-07-15 NOTE — Assessment & Plan Note (Signed)
stable overall by history and exam, recent data reviewed with pt, and pt to continue medical treatment as before,  to f/u any worsening symptoms or concerns Lab Results  Component Value Date   LDLCALC 100* 06/07/2013   For f/u labs

## 2014-07-15 NOTE — Patient Instructions (Addendum)
You had the flu shot today  Please make a Nurse Visit appt for the new Prevnar pneumonia shot in 2 weeks  Please take all new medication as prescribed - the flonase  Please do not take sudafed type products OTC, as this can shut down the urinating ability  Please continue all other medications as before, and refills have been done if requested.  Please have the pharmacy call with any other refills you may need.  Please continue your efforts at being more active, low cholesterol diet, and weight control.  You are otherwise up to date with prevention measures today.  Please keep your appointments with your specialists as you may have planned  Please go to the LAB in the Basement (turn left off the elevator) for the tests to be done today  You will be contacted by phone if any changes need to be made immediately.  Otherwise, you will receive a letter about your results with an explanation, but please check with MyChart first.  Please remember to sign up for MyChart if you have not done so, as this will be important to you in the future with finding out test results, communicating by private email, and scheduling acute appointments online when needed.  Please return in 1 year for your yearly visit, or sooner if needed

## 2014-07-15 NOTE — Assessment & Plan Note (Signed)
stable overall by history and exam, recent data reviewed with pt, and pt to continue medical treatment as before,  to f/u any worsening symptoms or concerns Lab Results  Component Value Date   TSH 3.31 06/07/2013

## 2014-07-15 NOTE — Progress Notes (Signed)
Subjective:    Patient ID: Kevin Stein, male    DOB: 08-13-1946, 68 y.o.   MRN: [id number]  HPI  Here for yearly f/u;  Overall doing ok;  Pt denies CP, worsening SOB, DOE, wheezing, orthopnea, PND, worsening LE edema, palpitations, dizziness or syncope.  Pt denies neurological change such as new headache, facial or extremity weakness.  Pt denies polydipsia, polyuria, or low sugar symptoms. Pt states overall good compliance with treatment and medications, good tolerability, and has been trying to follow lower cholesterol diet.  Pt denies worsening depressive symptoms, suicidal ideation or panic. No fever, night sweats, wt loss, loss of appetite, or other constitutional symptoms.  Pt states good ability with ADL's, has low fall risk, home safety reviewed and adequate, no other significant changes in hearing or vision, and only occasionally active with exercise.  No current complaints except Does have several wks ongoing nasal allergy symptoms with clearish congestion, itch and sneezing, without fever, pain, ST, cough, swelling or wheezing., wondering about taking claritin D otc as he has in the past; Denies urinary symptoms such as dysuria, frequency, urgency, flank pain, hematuria or n/v, fever, chills but has variable urinary stream, still takes the flomax  Past Medical History  Diagnosis Date  . HYPOTHYROIDISM 07/06/2008    Qualifier: Diagnosis of  By: Jenny Reichmann MD, Hunt Oris   . HYPERLIPIDEMIA 07/06/2008    Qualifier: Diagnosis of  By: Jenny Reichmann MD, Hunt Oris   . ALLERGIC RHINITIS 07/06/2008    Qualifier: Diagnosis of  By: Jenny Reichmann MD, Hunt Oris   . 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 DIVERTICULOSIS, COLON 07/06/2008    Qualifier: Diagnosis of  By: Jenny Reichmann MD, [place], RIGHT SHOULDER 07/06/2008    Qualifier: Diagnosis of  By: Jenny Reichmann MD, [place], HX OF 07/06/2008    Qualifier: Diagnosis of  By: Jenny Reichmann MD, Hunt Oris   .  BENIGN PROSTATIC HYPERTROPHY 07/06/2008    Qualifier: Diagnosis of  By: Jenny Reichmann MD, Hunt Oris   . Peripheral vascular disease    Past Surgical History  Procedure Laterality Date  . Vasectomy    . Left middle ear surgury    . Right shoulder surgury  2010    Dr Veverly Fells  . Varicose vein surgery    . Axillary lymph node dissection      left     1978   . Transforaminal lumbar interbody fusion (tlif) with pedicle screw fixation 1 level      reports that he has never smoked. He has never used smokeless tobacco. He reports that he drinks alcohol. He reports that he does not use illicit drugs. family history includes Cancer in his father; Diabetes in his mother; Hypertension in his mother. No Known Allergies Current Outpatient Prescriptions on File Prior to Visit  Medication Sig Dispense Refill  . b complex vitamins tablet Take 1 tablet by mouth daily.      Marland Kitchen levothyroxine (SYNTHROID, LEVOTHROID) 25 MCG tablet Take 1 tablet (25 mcg total) by mouth daily before breakfast.  90 tablet  3  . pravastatin (PRAVACHOL) 40 MG tablet Take 1 tablet (40 mg total) by mouth daily.  90 tablet  3  . tamsulosin (FLOMAX) 0.4 MG CAPS capsule TAKE 1 CAPSULE DAILY AFTER SUPPER  90 capsule  0  . methocarbamol (ROBAXIN) 500 MG tablet Take 1 tablet (500 mg total)  by mouth every 6 (six) hours as needed (spasm).  60 tablet  3  . oxyCODONE-acetaminophen (PERCOCET) 10-325 MG per tablet Take 1-2 tablets by mouth every 4 (four) hours as needed for pain.  90 tablet  0   No current facility-administered medications on file prior to visit.   Review of Systems Constitutional: Negative for increased diaphoresis, other activity, appetite or other siginficant weight change  HENT: Negative for worsening hearing loss, ear pain, facial swelling, mouth sores and neck stiffness.   Eyes: Negative for other worsening pain, redness or visual disturbance.  Respiratory: Negative for shortness of breath and wheezing.   Cardiovascular: Negative  for chest pain and palpitations.  Gastrointestinal: Negative for diarrhea, blood in stool, abdominal distention or other pain Genitourinary: Negative for hematuria, flank pain or change in urine volume.  Musculoskeletal: Negative for myalgias or other joint complaints.  Skin: Negative for color change and wound.  Neurological: Negative for syncope and numbness. other than noted Hematological: Negative for adenopathy. or other swelling Psychiatric/Behavioral: Negative for hallucinations, self-injury, decreased concentration or other worsening agitation.      Objective:   Physical Exam BP 130/88  Pulse 73  Temp(Src) 97.7 F (36.5 C) (Oral)  Ht 6' (1.829 m)  Wt 224 lb 12 oz (101.946 kg)  BMI 30.47 kg/m2  SpO2 97% VS noted,  Constitutional: Pt is oriented to person, place, and time. Appears well-developed and well-nourished.  Head: Normocephalic and atraumatic.  Right Ear: External ear normal.  Left Ear: External ear normal.  Nose: Nose normal.  Mouth/Throat: Oropharynx is clear and moist.  Eyes: Conjunctivae and EOM are normal. Pupils are equal, round, and reactive to light.  Neck: Normal range of motion. Neck supple. No JVD present. No tracheal deviation present.  Cardiovascular: Normal rate, regular rhythm, normal heart sounds and intact distal pulses.   Pulmonary/Chest: Effort normal and breath sounds without rales or wheezing  Abdominal: Soft. Bowel sounds are normal. NT. No HSM  Musculoskeletal: Normal range of motion. Exhibits no edema.  Lymphadenopathy:  Has no cervical adenopathy.  Neurological: Pt is alert and oriented to person, place, and time. Pt has normal reflexes. No cranial nerve deficit. Motor grossly intact Skin: Skin is warm and dry. No rash noted.  Psychiatric:  Has normal mood and affect. Behavior is normal.     Assessment & Plan:

## 2014-07-15 NOTE — Progress Notes (Signed)
Pre visit review using our clinic review tool, if applicable. No additional management support is needed unless otherwise documented below in the visit note. 

## 2014-08-01 ENCOUNTER — Ambulatory Visit (INDEPENDENT_AMBULATORY_CARE_PROVIDER_SITE_OTHER): Payer: Medicare Other

## 2014-08-01 DIAGNOSIS — Z23 Encounter for immunization: Secondary | ICD-10-CM

## 2014-08-03 ENCOUNTER — Other Ambulatory Visit: Payer: Self-pay | Admitting: Internal Medicine

## 2015-02-24 ENCOUNTER — Encounter: Payer: Self-pay | Admitting: Internal Medicine

## 2015-04-10 ENCOUNTER — Telehealth: Payer: Self-pay | Admitting: Internal Medicine

## 2015-04-10 NOTE — Telephone Encounter (Signed)
Patient is requesting refills for pravastatin (PRAVACHOL) 40 MG tablet [[id number] and levothyroxine (SYNTHROID, LEVOTHROID) 25 MCG tablet [[id number]] pharmacy is [facility] order

## 2015-04-11 MED ORDER — PRAVASTATIN SODIUM 40 MG PO TABS: 40.0000 mg | ORAL_TABLET | Freq: Every day | ORAL | Status: DC

## 2015-04-11 MED ORDER — LEVOTHYROXINE SODIUM 25 MCG PO TABS: 25.0000 ug | ORAL_TABLET | Freq: Every day | ORAL | Status: DC

## 2015-07-12 ENCOUNTER — Encounter: Payer: Self-pay | Admitting: Internal Medicine

## 2015-07-26 ENCOUNTER — Encounter: Payer: Medicare Other | Admitting: Internal Medicine

## 2015-07-31 ENCOUNTER — Telehealth: Payer: Self-pay

## 2015-07-31 NOTE — Telephone Encounter (Signed)
Call to try and schedule AWV and lvm ; CPE scheduled for 10/25 at 3:30pm

## 2015-08-01 NOTE — Telephone Encounter (Signed)
Call back to fup on AWV; Spoke with wife and had conflict prior to his apt with Dr. Jenny Reichmann, so agreed to see post apt around 4pm

## 2015-08-01 NOTE — Telephone Encounter (Signed)
Patient will come in for AWV, but your schedule is taken with another patient at that time Can you please call him at [phone] to schedule with him

## 2015-08-15 ENCOUNTER — Ambulatory Visit (INDEPENDENT_AMBULATORY_CARE_PROVIDER_SITE_OTHER): Payer: Medicare Other | Admitting: Internal Medicine

## 2015-08-15 ENCOUNTER — Other Ambulatory Visit (INDEPENDENT_AMBULATORY_CARE_PROVIDER_SITE_OTHER): Payer: Medicare Other

## 2015-08-15 VITALS — BP 160/70 | HR 69 | Temp 97.9°F | Ht 72.0 in | Wt 235.8 lb

## 2015-08-15 DIAGNOSIS — E039 Hypothyroidism, unspecified: Secondary | ICD-10-CM

## 2015-08-15 DIAGNOSIS — Z0000 Encounter for general adult medical examination without abnormal findings: Secondary | ICD-10-CM

## 2015-08-15 DIAGNOSIS — Z20828 Contact with and (suspected) exposure to other viral communicable diseases: Secondary | ICD-10-CM

## 2015-08-15 DIAGNOSIS — E785 Hyperlipidemia, unspecified: Secondary | ICD-10-CM | POA: Diagnosis not present

## 2015-08-15 DIAGNOSIS — N320 Bladder-neck obstruction: Secondary | ICD-10-CM

## 2015-08-15 DIAGNOSIS — M25562 Pain in left knee: Secondary | ICD-10-CM | POA: Diagnosis not present

## 2015-08-15 DIAGNOSIS — Z23 Encounter for immunization: Secondary | ICD-10-CM

## 2015-08-15 LAB — LIPID PANEL
CHOL/HDL RATIO: 4
Cholesterol: 165 mg/dL (ref 0–200)
HDL: 43.7 mg/dL (ref >39.00)
LDL CALC: 104 mg/dL — AB (ref 0–99)
NONHDL: 121.69
Triglycerides: 88 mg/dL (ref 0.0–149.0)
VLDL: 17.6 mg/dL (ref 0.0–40.0)

## 2015-08-15 LAB — CBC WITH DIFFERENTIAL/PLATELET
BASOS ABS: 0 10*3/uL (ref 0.0–0.1)
Basophils Relative: 0.4 % (ref 0.0–3.0)
EOS PCT: 3.4 % (ref 0.0–5.0)
Eosinophils Absolute: 0.2 10*3/uL (ref 0.0–0.7)
HEMATOCRIT: 48 % (ref 39.0–52.0)
Hemoglobin: 16 g/dL (ref 13.0–17.0)
LYMPHS PCT: 19.8 % (ref 12.0–46.0)
Lymphs Abs: 1.4 10*3/uL (ref 0.7–4.0)
MCHC: 33.5 g/dL (ref 30.0–36.0)
MCV: 91.4 fl (ref 78.0–100.0)
MONOS PCT: 9.2 % (ref 3.0–12.0)
Monocytes Absolute: 0.6 10*3/uL (ref 0.1–1.0)
NEUTROS ABS: 4.7 10*3/uL (ref 1.4–7.7)
Neutrophils Relative %: 67.2 % (ref 43.0–77.0)
PLATELETS: 184 10*3/uL (ref 150.0–400.0)
RBC: 5.24 Mil/uL (ref 4.22–5.81)
RDW: 13.6 % (ref 11.5–15.5)
WBC: 6.9 10*3/uL (ref 4.0–10.5)

## 2015-08-15 LAB — BASIC METABOLIC PANEL
BUN: 22 mg/dL (ref 6–23)
CALCIUM: 9.7 mg/dL (ref 8.4–10.5)
CO2: 29 meq/L (ref 19–32)
CREATININE: 0.81 mg/dL (ref 0.40–1.50)
Chloride: 107 mEq/L (ref 96–112)
GFR: 100.95 mL/min (ref >60.00)
Glucose, Bld: 93 mg/dL (ref 70–99)
Potassium: 4.7 mEq/L (ref 3.5–5.1)
SODIUM: 142 meq/L (ref 135–145)

## 2015-08-15 LAB — URINALYSIS, ROUTINE W REFLEX MICROSCOPIC
Bilirubin Urine: NEGATIVE
Hgb urine dipstick: NEGATIVE
Ketones, ur: 15 — AB
LEUKOCYTES UA: NEGATIVE
Nitrite: NEGATIVE
PH: 7 (ref 5.0–8.0)
RBC / HPF: NONE SEEN (ref 0–?)
SPECIFIC GRAVITY, URINE: 1.025 (ref 1.000–1.030)
TOTAL PROTEIN, URINE-UPE24: NEGATIVE
URINE GLUCOSE: NEGATIVE
UROBILINOGEN UA: 0.2 (ref 0.0–1.0)

## 2015-08-15 LAB — HEPATIC FUNCTION PANEL
ALT: 22 U/L (ref 0–53)
AST: 23 U/L (ref 0–37)
Albumin: 4.4 g/dL (ref 3.5–5.2)
Alkaline Phosphatase: 54 U/L (ref 39–117)
BILIRUBIN DIRECT: 0.2 mg/dL (ref 0.0–0.3)
BILIRUBIN TOTAL: 0.7 mg/dL (ref 0.2–1.2)
TOTAL PROTEIN: 6.9 g/dL (ref 6.0–8.3)

## 2015-08-15 LAB — PSA: PSA: 0.38 ng/mL (ref 0.10–4.00)

## 2015-08-15 LAB — TSH: TSH: 3.5 u[IU]/mL (ref 0.35–4.50)

## 2015-08-15 NOTE — Patient Instructions (Addendum)
You had the flu shot today  Please continue all other medications as before, and refills have been done if requested.  Please have the pharmacy call with any other refills you may need.  Please continue your efforts at being more active, low cholesterol diet, and weight control.  You are otherwise up to date with prevention measures today.  Please keep your appointments with your specialists as you may have planned  Please go to the LAB in the Basement (turn left off the elevator) for the tests to be done today  You will be contacted by phone if any changes need to be made immediately.  Otherwise, you will receive a letter about your results with an explanation, but please check with MyChart first.  Please remember to sign up for MyChart if you have not done so, as this will be important to you in the future with finding out test results, communicating by private email, and scheduling acute appointments online when needed.  Please return in 1 year for your yearly visit, or sooner if needed,   [name] , Thank you for taking time to come for your Medicare Wellness Visit. I appreciate your ongoing commitment to your health goals. Please review the following plan we discussed and let me know if I can assist you in the future.   Will take the flu shot today  Will try to lose a few pounds  Will schedule for colonoscopy soon  These are the goals we discussed: Goals    . patient     Going on vacation in Dec; may try to lose a few pounds;  Will cut back on food;        This is a list of the screening recommended for you and due dates:  Health Maintenance  Topic Date Due  .  Hepatitis C: One time screening is recommended by Center for Disease Control  (CDC) for  adults born from [birth year] through 1963.   November 04, 1945  . Flu Shot  05/22/2015  . Colon Cancer Screening  07/19/2015  . Tetanus Vaccine  10/03/2019  . Shingles Vaccine  Completed  . Pneumonia vaccines  Completed     Fat  and Cholesterol Restricted Diet High levels of fat and cholesterol in your blood may lead to various health problems, such as diseases of the heart, blood vessels, gallbladder, liver, and pancreas. Fats are concentrated sources of energy that come in various forms. Certain types of fat, including saturated fat, may be harmful in excess. Cholesterol is a substance needed by your body in small amounts. Your body makes all the cholesterol it needs. Excess cholesterol comes from the food you eat. When you have high levels of cholesterol and saturated fat in your blood, health problems can develop because the excess fat and cholesterol will gather along the walls of your blood vessels, causing them to narrow. Choosing the right foods will help you control your intake of fat and cholesterol. This will help keep the levels of these substances in your blood within normal limits and reduce your risk of disease. WHAT IS MY PLAN? Your health care provider recommends that you:  Get no more than __________ % of the total calories in your daily diet from fat.  Limit your intake of saturated fat to less than ______% of your total calories each day.  Limit the amount of cholesterol in your diet to less than _________mg per day. WHAT TYPES OF FAT SHOULD I CHOOSE?  Choose healthy fats more  often. Choose monounsaturated and polyunsaturated fats, such as olive and canola oil, flaxseeds, walnuts, almonds, and seeds.  Eat more omega-3 fats. Good choices include salmon, mackerel, sardines, tuna, flaxseed oil, and ground flaxseeds. Aim to eat fish at least two times a week.  Limit saturated fats. Saturated fats are primarily found in animal products, such as meats, butter, and cream. Plant sources of saturated fats include palm oil, palm kernel oil, and coconut oil.  Avoid foods with partially hydrogenated oils in them. These contain trans fats. Examples of foods that contain trans fats are stick margarine, some tub  margarines, cookies, crackers, and other baked goods. WHAT GENERAL GUIDELINES DO I NEED TO FOLLOW? These guidelines for healthy eating will help you control your intake of fat and cholesterol:  Check food labels carefully to identify foods with trans fats or high amounts of saturated fat.  Fill one half of your plate with vegetables and green salads.  Fill one fourth of your plate with whole grains. Look for the word "whole" as the first word in the ingredient list.  Fill one fourth of your plate with lean protein foods.  Limit fruit to two servings a day. Choose fruit instead of juice.  Eat more foods that contain soluble fiber. Examples of foods that contain this type of fiber are apples, broccoli, carrots, beans, peas, and barley. Aim to get 20-30 g of fiber per day.  Eat more home-cooked food and less restaurant, buffet, and fast food.  Limit or avoid alcohol.  Limit foods high in starch and sugar.  Limit fried foods.  Cook foods using methods other than frying. Baking, boiling, grilling, and broiling are all great options.  Lose weight if you are overweight. Losing just 5-10% of your initial body weight can help your overall health and prevent diseases such as diabetes and heart disease. WHAT FOODS CAN I EAT? Grains Whole grains, such as whole wheat or whole grain breads, crackers, cereals, and pasta. Unsweetened oatmeal, bulgur, barley, quinoa, or brown rice. Corn or whole wheat flour tortillas. Vegetables Fresh or frozen vegetables (raw, steamed, roasted, or grilled). Green salads. Fruits All fresh, canned (in natural juice), or frozen fruits. Meat and Other Protein Products Ground beef (85% or leaner), grass-fed beef, or beef trimmed of fat. Skinless chicken or [place]. Ground chicken or [place]. Pork trimmed of fat. All fish and seafood. Eggs. Dried beans, peas, or lentils. Unsalted nuts or seeds. Unsalted canned or dry beans. Dairy Low-fat dairy products, such as skim or  1% milk, 2% or reduced-fat cheeses, low-fat ricotta or cottage cheese, or plain low-fat yogurt. Fats and Oils Tub margarines without trans fats. Light or reduced-fat mayonnaise and salad dressings. Avocado. Olive, canola, sesame, or safflower oils. Natural peanut or almond butter (choose ones without added sugar and oil). The items listed above may not be a complete list of recommended foods or beverages. Contact your dietitian for more options. WHAT FOODS ARE NOT RECOMMENDED? Grains White bread. White pasta. White rice. Cornbread. Bagels, pastries, and croissants. Crackers that contain trans fat. Vegetables White potatoes. Corn. Creamed or fried vegetables. Vegetables in a cheese sauce. Fruits Dried fruits. Canned fruit in light or heavy syrup. Fruit juice. Meat and Other Protein Products Fatty cuts of meat. Ribs, chicken wings, bacon, sausage, bologna, salami, chitterlings, fatback, hot dogs, bratwurst, and packaged luncheon meats. Liver and organ meats. Dairy Whole or 2% milk, cream, half-and-half, and cream cheese. Whole milk cheeses. Whole-fat or sweetened yogurt. Full-fat cheeses. Nondairy creamers and whipped toppings.  Processed cheese, cheese spreads, or cheese curds. Sweets and Desserts Corn syrup, sugars, honey, and molasses. Candy. Jam and jelly. Syrup. Sweetened cereals. Cookies, pies, cakes, donuts, muffins, and ice cream. Fats and Oils Butter, stick margarine, lard, shortening, ghee, or bacon fat. Coconut, palm kernel, or palm oils. Beverages Alcohol. Sweetened drinks (such as sodas, lemonade, and fruit drinks or punches). The items listed above may not be a complete list of foods and beverages to avoid. Contact your dietitian for more information.   This information is not intended to replace advice given to you by your health care provider. Make sure you discuss any questions you have with your health care provider.   Document Released: 10/07/2005 Document Revised: 10/28/2014  Document Reviewed: 01/05/2014 Elsevier Interactive Patient Education 2016 [place] Maintenance, Male A healthy lifestyle and preventative care can promote health and wellness.  Maintain regular health, dental, and eye exams.  Eat a healthy diet. Foods like vegetables, fruits, whole grains, low-fat dairy products, and lean protein foods contain the nutrients you need and are low in calories. Decrease your intake of foods high in solid fats, added sugars, and salt. Get information about a proper diet from your health care provider, if necessary.  Regular physical exercise is one of the most important things you can do for your health. Most adults should get at least 150 minutes of moderate-intensity exercise (any activity that increases your heart rate and causes you to sweat) each week. In addition, most adults need muscle-strengthening exercises on 2 or more days a week.   Maintain a healthy weight. The body mass index (BMI) is a screening tool to identify possible weight problems. It provides an estimate of body fat based on height and weight. Your health care provider can find your BMI and can help you achieve or maintain a healthy weight. For males 20 years and older:  A BMI below 18.5 is considered underweight.  A BMI of 18.5 to 24.9 is normal.  A BMI of 25 to 29.9 is considered overweight.  A BMI of 30 and above is considered obese.  Maintain normal blood lipids and cholesterol by exercising and minimizing your intake of saturated fat. Eat a balanced diet with plenty of fruits and vegetables. Blood tests for lipids and cholesterol should begin at age 23 and be repeated every 5 years. If your lipid or cholesterol levels are high, you are over age 34, or you are at high risk for heart disease, you may need your cholesterol levels checked more frequently.Ongoing high lipid and cholesterol levels should be treated with medicines if diet and exercise are not working.  If you  smoke, find out from your health care provider how to quit. If you do not use tobacco, do not start.  Lung cancer screening is recommended for adults aged 37-80 years who are at high risk for developing lung cancer because of a history of smoking. A yearly low-dose CT scan of the lungs is recommended for people who have at least a 30-pack-year history of smoking and are current smokers or have quit within the past 15 years. A pack year of smoking is smoking an average of 1 pack of cigarettes a day for 1 year (for example, a 30-pack-year history of smoking could mean smoking 1 pack a day for 30 years or 2 packs a day for 15 years). Yearly screening should continue until the smoker has stopped smoking for at least 15 years. Yearly screening should be stopped for people  who develop a health problem that would prevent them from having lung cancer treatment.  If you choose to drink alcohol, do not have more than 2 drinks per day. One drink is considered to be 12 oz (360 mL) of beer, 5 oz (150 mL) of wine, or 1.5 oz (45 mL) of liquor.  Avoid the use of street drugs. Do not share needles with anyone. Ask for help if you need support or instructions about stopping the use of drugs.  High blood pressure causes heart disease and increases the risk of stroke. High blood pressure is more likely to develop in:  People who have blood pressure in the end of the normal range (100-139/85-89 mm Hg).  People who are overweight or obese.  People who are African American.  If you are 22-48 years of age, have your blood pressure checked every 3-5 years. If you are 68 years of age or older, have your blood pressure checked every year. You should have your blood pressure measured twice--once when you are at a hospital or clinic, and once when you are not at a hospital or clinic. Record the average of the two measurements. To check your blood pressure when you are not at a hospital or clinic, you can use:  An automated  blood pressure machine at a pharmacy.  A home blood pressure monitor.  If you are 30-48 years old, ask your health care provider if you should take aspirin to prevent heart disease.  Diabetes screening involves taking a blood sample to check your fasting blood sugar level. This should be done once every 3 years after age 8 if you are at a normal weight and without risk factors for diabetes. Testing should be considered at a younger age or be carried out more frequently if you are overweight and have at least 1 risk factor for diabetes.  Colorectal cancer can be detected and often prevented. Most routine colorectal cancer screening begins at the age of 42 and continues through age 37. However, your health care provider may recommend screening at an earlier age if you have risk factors for colon cancer. On a yearly basis, your health care provider may provide home test kits to check for hidden blood in the stool. A small camera at the end of a tube may be used to directly examine the colon (sigmoidoscopy or colonoscopy) to detect the earliest forms of colorectal cancer. Talk to your health care provider about this at age 57 when routine screening begins. A direct exam of the colon should be repeated every 5-10 years through age 75, unless early forms of precancerous polyps or small growths are found.  People who are at an increased risk for hepatitis B should be screened for this virus. You are considered at high risk for hepatitis B if:  You were born in a country where hepatitis B occurs often. Talk with your health care provider about which countries are considered high risk.  Your parents were born in a high-risk country and you have not received a shot to protect against hepatitis B (hepatitis B vaccine).  You have HIV or AIDS.  You use needles to inject street drugs.  You live with, or have sex with, someone who has hepatitis B.  You are a man who has sex with other men (MSM).  You get  hemodialysis treatment.  You take certain medicines for conditions like cancer, organ transplantation, and autoimmune conditions.  Hepatitis C blood testing is recommended for all people  born from 40 through 1965 and any individual with known risk factors for hepatitis C.  Healthy men should no longer receive prostate-specific antigen (PSA) blood tests as part of routine cancer screening. Talk to your health care provider about prostate cancer screening.  Testicular cancer screening is not recommended for adolescents or adult males who have no symptoms. Screening includes self-exam, a health care provider exam, and other screening tests. Consult with your health care provider about any symptoms you have or any concerns you have about testicular cancer.  Practice safe sex. Use condoms and avoid high-risk sexual practices to reduce the spread of sexually transmitted infections (STIs).  You should be screened for STIs, including gonorrhea and chlamydia if:  You are sexually active and are younger than 24 years.  You are older than 24 years, and your health care provider tells you that you are at risk for this type of infection.  Your sexual activity has changed since you were last screened, and you are at an increased risk for chlamydia or gonorrhea. Ask your health care provider if you are at risk.  If you are at risk of being infected with HIV, it is recommended that you take a prescription medicine daily to prevent HIV infection. This is called pre-exposure prophylaxis (PrEP). You are considered at risk if:  You are a man who has sex with other men (MSM).  You are a heterosexual man who is sexually active with multiple partners.  You take drugs by injection.  You are sexually active with a partner who has HIV.  Talk with your health care provider about whether you are at high risk of being infected with HIV. If you choose to begin PrEP, you should first be tested for HIV. You should  then be tested every 3 months for as long as you are taking PrEP.  Use sunscreen. Apply sunscreen liberally and repeatedly throughout the day. You should seek shade when your shadow is shorter than you. Protect yourself by wearing long sleeves, pants, a wide-brimmed hat, and sunglasses year round whenever you are outdoors.  Tell your health care provider of new moles or changes in moles, especially if there is a change in shape or color. Also, tell your health care provider if a mole is larger than the size of a pencil eraser.  A one-time screening for abdominal aortic aneurysm (AAA) and surgical repair of large AAAs by ultrasound is recommended for men aged 51-75 years who are current or former smokers.  Stay current with your vaccines (immunizations).   This information is not intended to replace advice given to you by your health care provider. Make sure you discuss any questions you have with your health care provider.   Document Released: 04/04/2008 Document Revised: 10/28/2014 Document Reviewed: 03/04/2011 Elsevier Interactive Patient Education [employer].

## 2015-08-15 NOTE — Progress Notes (Signed)
Subjective:    Patient ID: Kevin Stein, male    DOB: 1946-02-03, 69 y.o.   MRN: [id number]  HPI  Here for yearly f/u;  Overall doing ok;  Pt denies Chest pain, worsening SOB, DOE, wheezing, orthopnea, PND, worsening LE edema, palpitations, dizziness or syncope.  Pt denies neurological change such as new headache, facial or extremity weakness.  Pt denies polydipsia, polyuria, or low sugar symptoms. Pt states overall good compliance with treatment and medications, good tolerability, and has been trying to follow appropriate diet.  Pt denies worsening depressive symptoms, suicidal ideation or panic. No fever, night sweats, wt loss, loss of appetite, or other constitutional symptoms.  Pt states good ability with ADL's, has low fall risk, home safety reviewed and adequate, no other significant changes in hearing or vision, and only occasionally active with exercise.  Does co medial left knee pain without swelling x 2 wks, overall minimal now, does not feel need for further eval for now. Ambulates well. Past Medical History  Diagnosis Date  . HYPOTHYROIDISM 07/06/2008    Qualifier: Diagnosis of  By: Jenny Reichmann MD, Hunt Oris   . HYPERLIPIDEMIA 07/06/2008    Qualifier: Diagnosis of  By: Jenny Reichmann MD, Hunt Oris   . ALLERGIC RHINITIS 07/06/2008    Qualifier: Diagnosis of  By: Jenny Reichmann MD, Hunt Oris   . 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 DIVERTICULOSIS, COLON 07/06/2008    Qualifier: Diagnosis of  By: Jenny Reichmann MD, [place], RIGHT SHOULDER 07/06/2008    Qualifier: Diagnosis of  By: Jenny Reichmann MD, [place], HX OF 07/06/2008    Qualifier: Diagnosis of  By: Jenny Reichmann MD, Hunt Oris   . BENIGN PROSTATIC HYPERTROPHY 07/06/2008    Qualifier: Diagnosis of  By: Jenny Reichmann MD, Hunt Oris   . Peripheral vascular disease    Past Surgical History  Procedure Laterality Date  . Vasectomy    . Left middle ear surgury    . Right shoulder surgury  2010    Dr  Veverly Fells  . Varicose vein surgery    . Axillary lymph node dissection      left     1978   . Transforaminal lumbar interbody fusion (tlif) with pedicle screw fixation 1 level      reports that he has never smoked. He has never used smokeless tobacco. He reports that he drinks alcohol. He reports that he does not use illicit drugs. family history includes Cancer in his father; Diabetes in his mother; Hypertension in his mother. No Known Allergies Current Outpatient Prescriptions on File Prior to Visit  Medication Sig Dispense Refill  . b complex vitamins tablet Take 1 tablet by mouth daily.    . fluticasone (FLONASE) 50 MCG/ACT nasal spray Place 2 sprays into both nostrils daily. 16 g 5  . levothyroxine (SYNTHROID, LEVOTHROID) 25 MCG tablet Take 1 tablet (25 mcg total) by mouth daily before breakfast. 90 tablet 0  . pravastatin (PRAVACHOL) 40 MG tablet Take 1 tablet (40 mg total) by mouth daily. 90 tablet 0  . methocarbamol (ROBAXIN) 500 MG tablet Take 1 tablet (500 mg total) by mouth every 6 (six) hours as needed (spasm). (Patient not taking: Reported on 08/15/2015) 60 tablet 3  . oxyCODONE-acetaminophen (PERCOCET) 10-325 MG per tablet Take 1-2 tablets by mouth every 4 (four) hours as needed for pain. (Patient not taking: Reported on 08/15/2015)  90 tablet 0  . tamsulosin (FLOMAX) 0.4 MG CAPS capsule TAKE 1 CAPSULE DAILY AFTER SUPPER (Patient not taking: Reported on 08/15/2015) 90 capsule 3   No current facility-administered medications on file prior to visit.   Review of Systems Constitutional: Negative for increased diaphoresis, other activity, appetite or siginficant weight change other than noted HENT: Negative for worsening hearing loss, ear pain, facial swelling, mouth sores and neck stiffness.   Eyes: Negative for other worsening pain, redness or visual disturbance.  Respiratory: Negative for shortness of breath and wheezing  Cardiovascular: Negative for chest pain and palpitations.    Gastrointestinal: Negative for diarrhea, blood in stool, abdominal distention or other pain Genitourinary: Negative for hematuria, flank pain or change in urine volume.  Musculoskeletal: Negative for myalgias or other joint complaints.  Skin: Negative for color change and wound or drainage.  Neurological: Negative for syncope and numbness. other than noted Hematological: Negative for adenopathy. or other swelling Psychiatric/Behavioral: Negative for hallucinations, SI, self-injury, decreased concentration or other worsening agitation.      Objective:   Physical Exam BP 160/70 mmHg  Pulse 69  Temp(Src) 97.9 F (36.6 C) (Oral)  Ht 6' (1.829 m)  Wt 235 lb 12 oz (106.935 kg)  BMI 31.97 kg/m2  SpO2 95% VS noted,  Constitutional: Pt is oriented to person, place, and time. Appears well-developed and well-nourished, in no significant distress Head: Normocephalic and atraumatic.  Right Ear: External ear normal.  Left Ear: External ear normal.  Nose: Nose normal.  Mouth/Throat: Oropharynx is clear and moist.  Eyes: Conjunctivae and EOM are normal. Pupils are equal, round, and reactive to light.  Neck: Normal range of motion. Neck supple. No JVD present. No tracheal deviation present or significant neck LA or mass Cardiovascular: Normal rate, regular rhythm, normal heart sounds and intact distal pulses.   Pulmonary/Chest: Effort normal and breath sounds without rales or wheezing  Abdominal: Soft. Bowel sounds are normal. NT. No HSM  Musculoskeletal: Normal range of motion. Exhibits no edema.  Lymphadenopathy:  Has no cervical adenopathy.  Neurological: Pt is alert and oriented to person, place, and time. Pt has normal reflexes. No cranial nerve deficit. Motor grossly intact Skin: Skin is warm and dry. No rash noted.  Left knee with FROM, no effusion, mild medial joint line tender only Psychiatric:  Has normal mood and affect. Behavior is normal.     Assessment & Plan:

## 2015-08-15 NOTE — Progress Notes (Signed)
Subjective:   Kevin Stein is a 69 y.o. male who presents for Medicare Annual/Subsequent preventive examination.  Review of Systems:   Presents today for AWV;  Sprained his knee left knee; hurts today S/p replaced shoulder joint on the right; hx back fusion  Father had HTN; Brother has HTN; sister had htn Dad had aneurysm; mother got infection in snf, died at 42  Diet; eat 3 meals; breakfast has light cereal; juice; yogurt; Fruit Wife cooks [place] foods;  Has gained 20 lbs since retirement approx  Year ago Would like to lose a few pounds;  Exercise; Walk dogs 2.5 x 3 miles per day; then working around the home.  60 min 3 x a week  Home; one level; plans to age in place Would move if he needed to  Safe neighborhood;  Sun protection; wear hat MVA no; seat belt  Functional losses no but more difficulty bending over with back fusion  Bowel and bladder issues; flomax for bladder but did not use it because it didn't seem to make a difference.   Colonoscopy; 9/20011; rec'd letter from GI to make an apt for repeat colonoscopy  EKG; 05/2013 no sob, chest pain  Hep C due;  Flu vaccine; Will take today  Cardiac Risk Factors include: advanced age (>70men, >62 women);dyslipidemia;obesity (BMI >30kg/m2)Care givers; Dr. Cyndi Bender for eyes GI;      Objective:    Vitals: Ht 6' (1.829 m)  Wt 235 lb 12 oz (106.935 kg)  BMI 31.97 kg/m2  Tobacco History  Smoking status  . Never Smoker   Smokeless tobacco  . Never Used     Counseling given: Not Answered   Past Medical History  Diagnosis Date  . HYPOTHYROIDISM 07/06/2008    Qualifier: Diagnosis of  By: Jenny Reichmann MD, Hunt Oris   . HYPERLIPIDEMIA 07/06/2008    Qualifier: Diagnosis of  By: Jenny Reichmann MD, Hunt Oris   . ALLERGIC RHINITIS 07/06/2008    Qualifier: Diagnosis of  By: Jenny Reichmann MD, Hunt Oris   . 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 DIVERTICULOSIS, COLON 07/06/2008    Qualifier:  Diagnosis of  By: Jenny Reichmann MD, [place], RIGHT SHOULDER 07/06/2008    Qualifier: Diagnosis of  By: Jenny Reichmann MD, [place], HX OF 07/06/2008    Qualifier: Diagnosis of  By: Jenny Reichmann MD, Hunt Oris   . BENIGN PROSTATIC HYPERTROPHY 07/06/2008    Qualifier: Diagnosis of  By: Jenny Reichmann MD, Hunt Oris   . Peripheral vascular disease    Past Surgical History  Procedure Laterality Date  . Vasectomy    . Left middle ear surgury    . Right shoulder surgury  2010    Dr Veverly Fells  . Varicose vein surgery    . Axillary lymph node dissection      left     1978   . Transforaminal lumbar interbody fusion (tlif) with pedicle screw fixation 1 level     Family History  Problem Relation Age of Onset  . Diabetes Mother   . Hypertension Mother   . Cancer Father    History  Sexual Activity  . Sexual Activity: Yes  . Birth Control/ Protection: None    Outpatient Encounter Prescriptions as of 08/15/2015  Medication Sig  . b complex vitamins tablet Take 1 tablet by mouth daily.  . fluticasone (FLONASE) 50 MCG/ACT nasal spray Place  2 sprays into both nostrils daily.  Marland Kitchen levothyroxine (SYNTHROID, LEVOTHROID) 25 MCG tablet Take 1 tablet (25 mcg total) by mouth daily before breakfast.  . methocarbamol (ROBAXIN) 500 MG tablet Take 1 tablet (500 mg total) by mouth every 6 (six) hours as needed (spasm).  Marland Kitchen oxyCODONE-acetaminophen (PERCOCET) 10-325 MG per tablet Take 1-2 tablets by mouth every 4 (four) hours as needed for pain.  . pravastatin (PRAVACHOL) 40 MG tablet Take 1 tablet (40 mg total) by mouth daily.  . tamsulosin (FLOMAX) 0.4 MG CAPS capsule TAKE 1 CAPSULE DAILY AFTER SUPPER   No facility-administered encounter medications on file as of 08/15/2015.    Activities of Daily Living In your present state of health, do you have any difficulty performing the following activities: 08/15/2015  Hearing? (No Data)  Vision? (No Data)  Difficulty concentrating or making decisions? (No  Data)  Walking or climbing stairs? N  Dressing or bathing? N  Doing errands, shopping? N  Preparing Food and eating ? N  Using the Toilet? N  In the past six months, have you accidently leaked urine? (No Data)  Do you have problems with loss of bowel control? N  Managing your Medications? N  Managing your Finances? N  Housekeeping or managing your Housekeeping? N    Patient Care Team: Biagio Borg, MD as PCP - General   Assessment:    Assessment   Patient presents for yearly preventative medicine examination. Medicare questionnaire screening were completed, i.e. Functional; fall risk; depression, memory loss and hearing. Some hearing loss due to surgery on left; no issues at this time; otherwise unremarkable  All immunizations and health maintenance protocols were reviewed with the patient and needed orders were placed. Will take the flu shot today  Education provided for laboratory screens;    Medication reconciliation, past medical history, social history, problem list and allergies were reviewed in detail with the patient  Goals were established to lose weight and get ready for travel;  End of life planning was discussed and POA brought in today    Exercise Activities and Dietary recommendations Current Exercise Habits:: Home exercise routine, Type of exercise: walking, Time (Minutes): 60, Frequency (Times/Week): 3, Weekly Exercise (Minutes/Week): 180, Intensity: Moderate  Goals    . patient     Going on vacation in Dec; may try to lose a few pounds;  Will cut back on food;       Fall Risk Fall Risk  08/15/2015 07/15/2014 06/11/2013  Falls in the past year? No No Yes  Number falls in past yr: - - 1  Injury with Fall? - - Yes   Depression Screen PHQ 2/9 Scores 07/15/2014 06/11/2013  PHQ - 2 Score 0 1    Cognitive Testing MMSE - Mini Mental State Exam 08/15/2015  Not completed: (No Data)    Immunization History  Administered Date(s) Administered  .  Influenza,inj,Quad PF,36+ Mos 07/15/2014  . Pneumococcal Conjugate-13 08/01/2014  . Pneumococcal Polysaccharide-23 06/11/2013  . Td 10/02/2009  . Zoster 07/06/2008   Screening Tests Health Maintenance  Topic Date Due  . Hepatitis C Screening  [birth year]  . INFLUENZA VACCINE  05/22/2015  . COLONOSCOPY  07/19/2015  . TETANUS/TDAP  10/03/2019  . ZOSTAVAX  Completed  . PNA vac Low Risk Adult  Completed      Plan:    During the course of the visit the patient was g and preventive services: educated and counseled about the following appropriate screenin  Vaccines to include Pneumoccal, Influenza, Hepatitis  B, Td, Zostavax, HCV/  Will take high does flu today  Electrocardiogram/ deferred   Cardiovascular Disease/ no c/o of new issues  Colorectal cancer screening- just rec'd notice to repeat  Diabetes screening/ deferred   Prostate Cancer Screening/ deferred   Glaucoma screening eye exam last year ok  Nutrition counseling / discussed some weight loss    Smoking cessation counseling/ not applicable  Patient Instructions (the written plan) was given to the patient.    Wynetta Fines, RN  08/15/2015

## 2015-08-15 NOTE — Assessment & Plan Note (Signed)
stable overall by history and exam, recent data reviewed with pt, and pt to continue medical treatment as before,  to f/u any worsening symptoms or concerns Lab Results  Component Value Date   TSH 4.25 07/15/2014

## 2015-08-15 NOTE — Assessment & Plan Note (Signed)
Mild, improving, ? msk strain, for tylenol prn, consider sport med/Dr Tamala Julian if worsens,  to f/u any worsening symptoms or concerns

## 2015-08-15 NOTE — Assessment & Plan Note (Signed)
stable overall by history and exam, recent data reviewed with pt, and pt to continue medical treatment as before,  to f/u any worsening symptoms or concerns Lab Results  Component Value Date   LDLCALC 116* 07/15/2014

## 2015-08-16 ENCOUNTER — Encounter: Payer: Self-pay | Admitting: Internal Medicine

## 2015-08-16 LAB — HEPATITIS C ANTIBODY: HCV AB: NEGATIVE

## 2015-10-05 ENCOUNTER — Ambulatory Visit (AMBULATORY_SURGERY_CENTER): Payer: Self-pay

## 2015-10-05 VITALS — Ht 72.0 in | Wt 238.0 lb

## 2015-10-05 DIAGNOSIS — Z860100 Personal history of colon polyps, unspecified: Secondary | ICD-10-CM

## 2015-10-05 MED ORDER — SUPREP BOWEL PREP KIT 17.5-3.13-1.6 GM/177ML PO SOLN: 1.0000 | Freq: Once | ORAL | Status: DC

## 2015-10-05 NOTE — Progress Notes (Signed)
No allergies to eggs or soy No diet/weight loss meds No home oxygen No past problems with anesthesia  Has email and internet; refused emmi 

## 2015-10-19 ENCOUNTER — Ambulatory Visit (AMBULATORY_SURGERY_CENTER): Payer: Medicare Other | Admitting: Internal Medicine

## 2015-10-19 ENCOUNTER — Encounter: Payer: Self-pay | Admitting: Internal Medicine

## 2015-10-19 VITALS — BP 134/81 | HR 64 | Temp 97.2°F | Resp 19 | Ht 72.0 in | Wt 238.0 lb

## 2015-10-19 DIAGNOSIS — D122 Benign neoplasm of ascending colon: Secondary | ICD-10-CM

## 2015-10-19 DIAGNOSIS — E039 Hypothyroidism, unspecified: Secondary | ICD-10-CM | POA: Diagnosis not present

## 2015-10-19 DIAGNOSIS — Z86010 Personal history of colonic polyps: Secondary | ICD-10-CM

## 2015-10-19 DIAGNOSIS — Z1211 Encounter for screening for malignant neoplasm of colon: Secondary | ICD-10-CM | POA: Diagnosis not present

## 2015-10-19 DIAGNOSIS — I739 Peripheral vascular disease, unspecified: Secondary | ICD-10-CM | POA: Diagnosis not present

## 2015-10-19 MED ORDER — SODIUM CHLORIDE 0.9 % IV SOLN: 500.0000 mL | INTRAVENOUS | Status: DC

## 2015-10-19 NOTE — Progress Notes (Signed)
Patient awakening,vss,report to rn 

## 2015-10-19 NOTE — Progress Notes (Signed)
No problems noted in the recovery room. maw 

## 2015-10-19 NOTE — Progress Notes (Signed)
Called to room to assist during endoscopic procedure.  Patient ID and intended procedure confirmed with present staff. Received instructions for my participation in the procedure from the performing physician.  

## 2015-10-19 NOTE — Op Note (Signed)
[facility]  [address] [place], [id number]   COLONOSCOPY PROCEDURE REPORT  PATIENT: Kevin Stein, Kevin Stein  MR#: [id number] BIRTHDATE: Dec 10, 1945 , 69  yrs. old GENDER: male ENDOSCOPIST: Eustace Quail, MD REFERRED [id number] Program Recall PROCEDURE DATE:  10/19/2015 PROCEDURE:   Colonoscopy, surveillance and Colonoscopy with snare polypectomy x 1 First Screening Colonoscopy - Avg.  risk and is 50 yrs.  old or older - No.  Prior Negative Screening - Now for repeat screening. N/A  History of Adenoma - Now for follow-up colonoscopy & has been > or = to 3 yrs.  Yes hx of adenoma.  Has been 3 or more years since last colonoscopy.  Polyps removed today? Yes ASA CLASS:   Class II INDICATIONS:Surveillance due to prior colonic neoplasia and PH Colon Adenoma.. Index exam 2003 (tubular adenoma), follow-up 2006, 2011 (tubular adenoma) MEDICATIONS: Monitored anesthesia care and Propofol 350 mg IV  DESCRIPTION OF PROCEDURE:   After the risks benefits and alternatives of the procedure were thoroughly explained, informed consent was obtained.  The digital rectal exam revealed no abnormalities of the rectum.   The LB [id number]  endoscope was introduced through the anus and advanced to the cecum, which was identified by both the appendix and ileocecal valve. No adverse events experienced.   The quality of the prep was excellent. (Suprep was used)  The instrument was then slowly withdrawn as the colon was fully examined. Estimated blood loss is zero unless otherwise noted in this procedure report.  COLON FINDINGS: A single polyp measuring 2 mm in size was found in the ascending colon.  A polypectomy was performed with a cold snare.  The resection was complete, the polyp tissue was completely retrieved and sent to histology.   There was moderate diverticulosis noted in the sigmoid colon.   The examination was otherwise normal.  Retroflexed views revealed no  abnormalities. The time to cecum = 20.6 Withdrawal time = 10.5   The scope was withdrawn and the procedure completed. COMPLICATIONS: There were no immediate complications.  ENDOSCOPIC IMPRESSION: 1.   Single polyp was found in the ascending colon; polypectomy was performed with a cold snare 2.   Moderate diverticulosis was noted in the sigmoid colon 3.   The examination was otherwise normal  RECOMMENDATIONS: 1. Follow up colonoscopy in 5 years  eSigned:  Eustace Quail, MD 10/19/2015 2:33 PM   cc: The Patient and Biagio Borg, MD

## 2015-10-19 NOTE — Patient Instructions (Signed)
YOU HAD AN ENDOSCOPIC PROCEDURE TODAY AT THE [facility]:   Refer to the procedure report that was given to you for any specific questions about what was found during the examination.  If the procedure report does not answer your questions, please call your gastroenterologist to clarify.  If you requested that your care partner not be given the details of your procedure findings, then the procedure report has been included in a sealed envelope for you to review at your convenience later.  YOU SHOULD EXPECT: Some feelings of bloating in the abdomen. Passage of more gas than usual.  Walking can help get rid of the air that was put into your GI tract during the procedure and reduce the bloating. If you had a lower endoscopy (such as a colonoscopy or flexible sigmoidoscopy) you may notice spotting of blood in your stool or on the toilet paper. If you underwent a bowel prep for your procedure, you may not have a normal bowel movement for a few days.  Please Note:  You might notice some irritation and congestion in your nose or some drainage.  This is from the oxygen used during your procedure.  There is no need for concern and it should clear up in a day or so.  SYMPTOMS TO REPORT IMMEDIATELY:   Following lower endoscopy (colonoscopy or flexible sigmoidoscopy):  Excessive amounts of blood in the stool  Significant tenderness or worsening of abdominal pains  Swelling of the abdomen that is new, acute  Fever of 100F or higher  For urgent or emergent issues, a gastroenterologist can be reached at any hour by calling [phone].   DIET: Your first meal following the procedure should be a small meal and then it is ok to progress to your normal diet. Heavy or fried foods are harder to digest and may make you feel nauseous or bloated.  Likewise, meals heavy in dairy and vegetables can increase bloating.  Drink plenty of fluids but you should avoid alcoholic beverages for 24  hours.  ACTIVITY:  You should plan to take it easy for the rest of today and you should NOT DRIVE or use heavy machinery until tomorrow (because of the sedation medicines used during the test).    FOLLOW UP: Our staff will call the number listed on your records the next business day following your procedure to check on you and address any questions or concerns that you may have regarding the information given to you following your procedure. If we do not reach you, we will leave a message.  However, if you are feeling well and you are not experiencing any problems, there is no need to return our call.  We will assume that you have returned to your regular daily activities without incident.  If any biopsies were taken you will be contacted by phone or by letter within the next 1-3 weeks.  Please call us at [phone] if you have not heard about the biopsies in 3 weeks.    SIGNATURES/CONFIDENTIALITY: You and/or your care partner have signed paperwork which will be entered into your electronic medical record.  These signatures attest to the fact that that the information above on your After Visit Summary has been reviewed and is understood.  Full responsibility of the confidentiality of this discharge information lies with you and/or your care-partner.    Handouts were given to your care partner on polyps, diverticulosis, and a high fiber diet with liberal fluid intake. You may resume your   current medications today. Await biopsy results. Please call if any questions or concerns.   

## 2015-10-20 ENCOUNTER — Telehealth: Payer: Self-pay | Admitting: *Deleted

## 2015-10-20 NOTE — Telephone Encounter (Signed)
  Follow up Call-  Call back number 10/19/2015  Post procedure Call Back phone  # [phone]  Permission to leave phone message Yes     Patient questions:  Do you have a fever, pain , or abdominal swelling? No. Pain Score  0 *  Have you tolerated food without any problems? Yes.    Have you been able to return to your normal activities? Yes.    Do you have any questions about your discharge instructions: Diet   No. Medications  No. Follow up visit  No.  Do you have questions or concerns about your Care? No.  Actions: * If pain score is 4 or above: No action needed, pain <4.

## 2015-10-26 ENCOUNTER — Encounter: Payer: Self-pay | Admitting: Internal Medicine

## 2015-11-09 ENCOUNTER — Telehealth: Payer: Self-pay | Admitting: *Deleted

## 2015-11-09 MED ORDER — PRAVASTATIN SODIUM 40 MG PO TABS: 40.0000 mg | ORAL_TABLET | Freq: Every day | ORAL | Status: DC

## 2015-11-09 MED ORDER — LEVOTHYROXINE SODIUM 25 MCG PO TABS: 25.0000 ug | ORAL_TABLET | Freq: Every day | ORAL | Status: DC

## 2015-11-09 NOTE — Telephone Encounter (Signed)
Pt wife called and stated [employer] should be sending md a request to have Pravastatin & Levothyroxine refilled. Inform wife will go ahead and send rx to [facility]...Johny Chess

## 2015-11-17 ENCOUNTER — Encounter: Payer: Self-pay | Admitting: Family

## 2015-11-17 ENCOUNTER — Ambulatory Visit (INDEPENDENT_AMBULATORY_CARE_PROVIDER_SITE_OTHER): Payer: Medicare Other | Admitting: Family

## 2015-11-17 VITALS — BP 104/80 | HR 72 | Temp 98.2°F | Resp 16 | Ht 72.0 in | Wt 237.0 lb

## 2015-11-17 DIAGNOSIS — J069 Acute upper respiratory infection, unspecified: Secondary | ICD-10-CM | POA: Diagnosis not present

## 2015-11-17 NOTE — Progress Notes (Signed)
Pre visit review using our clinic review tool, if applicable. No additional management support is needed unless otherwise documented below in the visit note. 

## 2015-11-17 NOTE — Progress Notes (Signed)
   Subjective:    Patient ID: Kevin Stein, male    DOB: Mar 27, 1946, 70 y.o.   MRN: [id number]  Chief Complaint  Patient presents with  . Cough    congestion, sore throat, a little bit of a productive cough x4 days    HPI:  Kevin Stein is a 70 y.o. male who  has a past medical history of HYPOTHYROIDISM (07/06/2008); HYPERLIPIDEMIA (07/06/2008); ALLERGIC RHINITIS (07/06/2008); Varices of other sites (12/21/2008); DIVERTICULOSIS, COLON (07/06/2008); DEGENERATIVE JOINT DISEASE, RIGHT SHOULDER (07/06/2008); COLONIC POLYPS, HX OF (07/06/2008); BENIGN PROSTATIC HYPERTROPHY (07/06/2008); and Peripheral vascular disease ([place]). and presents today for an acute office visit.  This is a new problem. Associated symptom of congestion, sore throat, and a slight productive cough have been going on for approximately 4 days. No fevers. Modifying factors include Claratin and Afrin. Course of the symptoms has stayed about the same since initial onset. No recent antibiotic use.   No Known Allergies   Current Outpatient Prescriptions on File Prior to Visit  Medication Sig Dispense Refill  . aspirin EC 81 MG tablet Take 81 mg by mouth daily.    Marland Kitchen levothyroxine (SYNTHROID, LEVOTHROID) 25 MCG tablet Take 1 tablet (25 mcg total) by mouth daily before breakfast. 90 tablet 2  . OVER THE COUNTER MEDICATION Fiber    . pravastatin (PRAVACHOL) 40 MG tablet Take 1 tablet (40 mg total) by mouth daily. 90 tablet 2  . vitamin C (ASCORBIC ACID) 500 MG tablet Take 500 mg by mouth daily.     No current facility-administered medications on file prior to visit.      Review of Systems  Constitutional: Negative for fever and chills.  HENT: Positive for congestion and sore throat.   Respiratory: Positive for cough. Negative for chest tightness, shortness of breath and wheezing.   Neurological: Negative for headaches.      Objective:    BP 104/80 mmHg  Pulse 72  Temp(Src) 98.2 F (36.8 C) (Oral)  Resp 16  Ht 6'  (1.829 m)  Wt 237 lb (107.502 kg)  BMI 32.14 kg/m2  SpO2 97% Nursing note and vital signs reviewed.  Physical Exam  Constitutional: He is oriented to person, place, and time. He appears well-developed and well-nourished. No distress.  HENT:  Right Ear: Hearing, tympanic membrane, external ear and ear canal normal.  Left Ear: Hearing, tympanic membrane, external ear and ear canal normal.  Nose: Nose normal. Right sinus exhibits no maxillary sinus tenderness and no frontal sinus tenderness. Left sinus exhibits no maxillary sinus tenderness and no frontal sinus tenderness.  Mouth/Throat: Uvula is midline and mucous membranes are normal. Posterior oropharyngeal erythema present.  Cardiovascular: Normal rate, regular rhythm, normal heart sounds and intact distal pulses.   Pulmonary/Chest: Effort normal and breath sounds normal.  Neurological: He is alert and oriented to person, place, and time.  Skin: Skin is warm and dry.  Psychiatric: He has a normal mood and affect. His behavior is normal. Judgment and thought content normal.       Assessment & Plan:   Problem List Items Addressed This Visit      Respiratory   Upper respiratory infection - Primary    Symptoms and exam consistent with acute upper respiratory infection and possible allergic rhinitis. Recommend conservative treatment with over-the-counter medications as needed for symptom relief and supportive care. Follow-up if symptoms worsen or fail to improve.

## 2015-11-17 NOTE — Patient Instructions (Addendum)
Thank you for choosing [facility].  Summary/Instructions:  If your symptoms worsen or fail to improve, please contact our office for further instruction, or in case of emergency go directly to the emergency room at the closest medical facility.   General Recommendations:    Please drink plenty of fluids.  Get plenty of rest   Sleep in humidified air  Use saline nasal sprays  Netti pot   OTC Medications:  Decongestants - helps relieve congestion   Flonase (generic fluticasone) or Nasacort (generic triamcinolone) - please make sure to use the "cross-over" technique at a 45 degree angle towards the opposite eye as opposed to straight up the nasal passageway.   Sudafed (generic pseudoephedrine - Note this is the one that is available behind the pharmacy counter); Products with phenylephrine (-PE) may also be used but is often not as effective as pseudoephedrine.   If you have HIGH BLOOD PRESSURE - Coricidin HBP; AVOID any product that is -D as this contains pseudoephedrine which may increase your blood pressure.  Afrin (oxymetazoline) every 6-8 hours for up to 3 days.   Allergies - helps relieve runny nose, itchy eyes and sneezing   Claritin (generic loratidine), Allegra (fexofenidine), or Zyrtec (generic cyrterizine) for runny nose. These medications should not cause drowsiness.  Note - Benadryl (generic diphenhydramine) may be used however may cause drowsiness  Cough -   Delsym or Robitussin (generic dextromethorphan)  Expectorants - helps loosen mucus to ease removal   Mucinex (generic guaifenesin) as directed on the package.  Headaches / General Aches   Tylenol (generic acetaminophen) - DO NOT EXCEED 3 grams (3,000 mg) in a 24 hour time period  Advil/Motrin (generic ibuprofen)   Sore Throat -   Salt water gargle   Chloraseptic (generic benzocaine) spray or lozenges / Sucrets (generic dyclonine)       

## 2015-11-17 NOTE — Assessment & Plan Note (Signed)
Symptoms and exam consistent with acute upper respiratory infection and possible allergic rhinitis. Recommend conservative treatment with over-the-counter medications as needed for symptom relief and supportive care. Follow-up if symptoms worsen or fail to improve.

## 2015-11-28 ENCOUNTER — Telehealth: Payer: Self-pay

## 2015-11-28 MED ORDER — PRAVASTATIN SODIUM 40 MG PO TABS: 40.0000 mg | ORAL_TABLET | Freq: Every day | ORAL | Status: DC

## 2015-11-28 MED ORDER — LEVOTHYROXINE SODIUM 25 MCG PO TABS: 25.0000 ug | ORAL_TABLET | Freq: Every day | ORAL | Status: DC

## 2015-11-28 NOTE — Telephone Encounter (Signed)
Medications have been sent to pharmacy 

## 2015-12-18 DIAGNOSIS — H2513 Age-related nuclear cataract, bilateral: Secondary | ICD-10-CM | POA: Diagnosis not present

## 2016-08-26 ENCOUNTER — Encounter: Payer: Self-pay | Admitting: Internal Medicine

## 2016-08-26 ENCOUNTER — Ambulatory Visit (INDEPENDENT_AMBULATORY_CARE_PROVIDER_SITE_OTHER): Payer: Medicare Other | Admitting: Internal Medicine

## 2016-08-26 ENCOUNTER — Other Ambulatory Visit: Payer: Self-pay | Admitting: Internal Medicine

## 2016-08-26 ENCOUNTER — Other Ambulatory Visit (INDEPENDENT_AMBULATORY_CARE_PROVIDER_SITE_OTHER): Payer: Medicare Other

## 2016-08-26 VITALS — BP 120/72 | HR 70 | Temp 98.6°F | Resp 20 | Wt 238.0 lb

## 2016-08-26 DIAGNOSIS — E039 Hypothyroidism, unspecified: Secondary | ICD-10-CM | POA: Diagnosis not present

## 2016-08-26 DIAGNOSIS — E785 Hyperlipidemia, unspecified: Secondary | ICD-10-CM

## 2016-08-26 DIAGNOSIS — N400 Enlarged prostate without lower urinary tract symptoms: Secondary | ICD-10-CM

## 2016-08-26 DIAGNOSIS — Z23 Encounter for immunization: Secondary | ICD-10-CM | POA: Diagnosis not present

## 2016-08-26 DIAGNOSIS — N320 Bladder-neck obstruction: Secondary | ICD-10-CM | POA: Diagnosis not present

## 2016-08-26 LAB — PSA: PSA: 0.48 ng/mL (ref 0.10–4.00)

## 2016-08-26 LAB — CBC WITH DIFFERENTIAL/PLATELET
BASOS ABS: 0 10*3/uL (ref 0.0–0.1)
BASOS PCT: 0.3 % (ref 0.0–3.0)
EOS ABS: 0.2 10*3/uL (ref 0.0–0.7)
Eosinophils Relative: 2.4 % (ref 0.0–5.0)
HCT: 48 % (ref 39.0–52.0)
Hemoglobin: 16.2 g/dL (ref 13.0–17.0)
LYMPHS ABS: 1.2 10*3/uL (ref 0.7–4.0)
Lymphocytes Relative: 18.9 % (ref 12.0–46.0)
MCHC: 33.8 g/dL (ref 30.0–36.0)
MCV: 91.3 fl (ref 78.0–100.0)
MONOS PCT: 9.5 % (ref 3.0–12.0)
Monocytes Absolute: 0.6 10*3/uL (ref 0.1–1.0)
NEUTROS ABS: 4.5 10*3/uL (ref 1.4–7.7)
NEUTROS PCT: 68.9 % (ref 43.0–77.0)
PLATELETS: 212 10*3/uL (ref 150.0–400.0)
RBC: 5.26 Mil/uL (ref 4.22–5.81)
RDW: 13.1 % (ref 11.5–15.5)
WBC: 6.6 10*3/uL (ref 4.0–10.5)

## 2016-08-26 LAB — HEPATIC FUNCTION PANEL
ALK PHOS: 54 U/L (ref 39–117)
ALT: 19 U/L (ref 0–53)
AST: 20 U/L (ref 0–37)
Albumin: 4.4 g/dL (ref 3.5–5.2)
BILIRUBIN DIRECT: 0.1 mg/dL (ref 0.0–0.3)
BILIRUBIN TOTAL: 0.6 mg/dL (ref 0.2–1.2)
TOTAL PROTEIN: 6.8 g/dL (ref 6.0–8.3)

## 2016-08-26 LAB — LIPID PANEL
Cholesterol: 168 mg/dL (ref 0–200)
HDL: 43.6 mg/dL (ref >39.00)
LDL Cholesterol: 108 mg/dL — ABNORMAL HIGH (ref 0–99)
NONHDL: 124.7
Total CHOL/HDL Ratio: 4
Triglycerides: 83 mg/dL (ref 0.0–149.0)
VLDL: 16.6 mg/dL (ref 0.0–40.0)

## 2016-08-26 LAB — URINALYSIS, ROUTINE W REFLEX MICROSCOPIC
Bilirubin Urine: NEGATIVE
HGB URINE DIPSTICK: NEGATIVE
Ketones, ur: NEGATIVE
Leukocytes, UA: NEGATIVE
Nitrite: NEGATIVE
SPECIFIC GRAVITY, URINE: 1.02 (ref 1.000–1.030)
Total Protein, Urine: NEGATIVE
URINE GLUCOSE: NEGATIVE
UROBILINOGEN UA: 0.2 (ref 0.0–1.0)
pH: 6 (ref 5.0–8.0)

## 2016-08-26 LAB — BASIC METABOLIC PANEL
BUN: 18 mg/dL (ref 6–23)
CHLORIDE: 107 meq/L (ref 96–112)
CO2: 30 meq/L (ref 19–32)
Calcium: 9.7 mg/dL (ref 8.4–10.5)
Creatinine, Ser: 0.92 mg/dL (ref 0.40–1.50)
GFR: 86.88 mL/min (ref >60.00)
Glucose, Bld: 98 mg/dL (ref 70–99)
Potassium: 4.7 mEq/L (ref 3.5–5.1)
SODIUM: 143 meq/L (ref 135–145)

## 2016-08-26 LAB — TSH: TSH: 5.45 u[IU]/mL — ABNORMAL HIGH (ref 0.35–4.50)

## 2016-08-26 MED ORDER — LEVOTHYROXINE SODIUM 50 MCG PO TABS: 50.0000 ug | ORAL_TABLET | Freq: Every day | ORAL | 3 refills | Status: DC

## 2016-08-26 MED ORDER — LEVOTHYROXINE SODIUM 25 MCG PO TABS: 25.0000 ug | ORAL_TABLET | Freq: Every day | ORAL | 3 refills | Status: DC

## 2016-08-26 MED ORDER — PRAVASTATIN SODIUM 40 MG PO TABS: 40.0000 mg | ORAL_TABLET | Freq: Every day | ORAL | 3 refills | Status: DC

## 2016-08-26 NOTE — Progress Notes (Signed)
Subjective:    Patient ID: Kevin Stein, male    DOB: [birth year], 70 y.o.   MRN: [id number]  HPI    Here for yearly f/u;  Overall doing ok;  Pt denies Chest pain, worsening SOB, DOE, wheezing, orthopnea, PND, worsening LE edema, palpitations, dizziness or syncope.  Pt denies neurological change such as new headache, facial or extremity weakness.  Pt denies polydipsia, polyuria, or low sugar symptoms. Pt states overall good compliance with treatment and medications, good tolerability, and has been trying to follow appropriate diet.  Pt denies worsening depressive symptoms, suicidal ideation or panic. No fever, night sweats, wt loss, loss of appetite, or other constitutional symptoms.  Pt states good ability with ADL's, has low fall risk, home safety reviewed and adequate, no other significant changes in hearing or vision, and only occasionally active with exercise. No other changes to history Denies urinary symptoms such as dysuria, frequency, urgency, flank pain, hematuria or n/v, fever, chills.  Denies hyper or hypo thyroid symptoms such as voice, skin or hair change. Past Medical History:  Diagnosis Date  . ALLERGIC RHINITIS 07/06/2008   Qualifier: Diagnosis of  By: Jenny Reichmann MD, [facility] Oris   . BENIGN PROSTATIC HYPERTROPHY 07/06/2008   Qualifier: Diagnosis of  By: Jenny Reichmann MD, [place], HX OF 07/06/2008   Qualifier: Diagnosis of  By: Jenny Reichmann MD, [place], RIGHT SHOULDER 07/06/2008   Qualifier: Diagnosis of  By: Jenny Reichmann MD, Hunt Oris   . DIVERTICULOSIS, COLON 07/06/2008   Qualifier: Diagnosis of  By: Jenny Reichmann MD, Hunt Oris   . HYPERLIPIDEMIA 07/06/2008   Qualifier: Diagnosis of  By: Jenny Reichmann MD, Hunt Oris   . HYPOTHYROIDISM 07/06/2008   Qualifier: Diagnosis of  By: Jenny Reichmann MD, Hunt Oris   . Peripheral vascular disease ([place])   . Varices of other sites 12/21/2008   Qualifier: Diagnosis of  By: Jenny Reichmann MD, Hunt Oris    Past Surgical History:  Procedure Laterality Date  . AXILLARY LYMPH  NODE DISSECTION     left     1978   . COLONOSCOPY    . left middle ear surgury    . right shoulder surgury  2010   Dr Veverly Fells  . TRANSFORAMINAL LUMBAR INTERBODY FUSION (TLIF) WITH PEDICLE SCREW FIXATION 1 LEVEL    . VARICOSE VEIN SURGERY    . VASECTOMY      reports that he has never smoked. He has never used smokeless tobacco. He reports that he drinks alcohol. He reports that he does not use drugs. family history includes Cancer in his father; Diabetes in his mother; Hypertension in his mother. No Known Allergies Current Outpatient Prescriptions on File Prior to Visit  Medication Sig Dispense Refill  . aspirin EC 81 MG tablet Take 81 mg by mouth daily.    Marland Kitchen OVER THE COUNTER MEDICATION Fiber    . vitamin C (ASCORBIC ACID) 500 MG tablet Take 500 mg by mouth daily.     No current facility-administered medications on file prior to visit.    Review of Systems  Constitutional: Negative for unusual diaphoresis or night sweats HENT: Negative for ear swelling or discharge Eyes: Negative for worsening visual haziness  Respiratory: Negative for choking and stridor.   Gastrointestinal: Negative for distension or worsening eructation Genitourinary: Negative for retention or change in urine volume.  Musculoskeletal: Negative for other MSK pain or swelling Skin: Negative for color change and worsening wound Neurological: Negative for tremors  and numbness other than noted  Psychiatric/Behavioral: Negative for decreased concentration or agitation other than above   All other system neg per pt    Objective:   Physical Exam BP 120/72   Pulse 70   Temp 98.6 F (37 C) (Oral)   Resp 20   Wt 238 lb (108 kg)   SpO2 98%   BMI 32.28 kg/m  VS noted,  Constitutional: Pt appears in no apparent distress HENT: Head: NCAT.  Right Ear: External ear normal.  Left Ear: External ear normal.  Eyes: . Pupils are equal, round, and reactive to light. Conjunctivae and EOM are normal Neck: Normal range of  motion. Neck supple.  Cardiovascular: Normal rate and regular rhythm.   Pulmonary/Chest: Effort normal and breath sounds without rales or wheezing.  Abd:  Soft, NT, ND, + BS Neurological: Pt is alert. Not confused , motor grossly intact Skin: Skin is warm. No rash, no LE edema Psychiatric: Pt behavior is normal. No agitation.  No other exam changes today ECG today I have personally interpreted Sinus  Rhythm  -Left atrial enlargement.     Assessment & Plan:

## 2016-08-26 NOTE — Progress Notes (Signed)
Pre visit review using our clinic review tool, if applicable. No additional management support is needed unless otherwise documented below in the visit note. 

## 2016-08-26 NOTE — Patient Instructions (Addendum)
You had the flu shot today  Your EKG was OK today  Please continue all other medications as before, and refills have been done if requested - to [facility]  Please have the pharmacy call with any other refills you may need.  Please continue your efforts at being more active, low cholesterol diet, and weight control.  You are otherwise up to date with prevention measures today.  Please keep your appointments with your specialists as you may have planned  Please go to the LAB in the Basement (turn left off the elevator) for the tests to be done today  You will be contacted by phone if any changes need to be made immediately.  Otherwise, you will receive a letter about your results with an explanation, but please check with MyChart first.  Please remember to sign up for MyChart if you have not done so, as this will be important to you in the future with finding out test results, communicating by private email, and scheduling acute appointments online when needed.  Please return in 1 year for your yearly visit, or sooner if needed

## 2016-08-27 ENCOUNTER — Encounter: Payer: Medicare Other | Admitting: Internal Medicine

## 2016-08-27 ENCOUNTER — Encounter: Payer: Self-pay | Admitting: Internal Medicine

## 2016-09-02 NOTE — Assessment & Plan Note (Signed)
stable overall by history and exam, recent data reviewed with pt, and pt to continue medical treatment as before,  to f/u any worsening symptoms or concerns Lab Results  Component Value Date   TSH 5.45 (H) 08/26/2016

## 2016-09-02 NOTE — Assessment & Plan Note (Signed)
stable overall by history and exam, recent data reviewed with pt, and pt to continue medical treatment as before,  to f/u any worsening symptoms or concerns Lab Results  Component Value Date   PSA 0.48 08/26/2016   PSA 0.38 08/15/2015   PSA 0.46 07/15/2014

## 2016-09-02 NOTE — Assessment & Plan Note (Signed)
stable overall by history and exam, recent data reviewed with pt, and pt to continue medical treatment as before,  to f/u any worsening symptoms or concerns Lab Results  Component Value Date   LDLCALC 108 (H) 08/26/2016

## 2016-09-27 ENCOUNTER — Other Ambulatory Visit: Payer: Self-pay

## 2016-12-23 DIAGNOSIS — H5203 Hypermetropia, bilateral: Secondary | ICD-10-CM | POA: Diagnosis not present

## 2016-12-23 DIAGNOSIS — H524 Presbyopia: Secondary | ICD-10-CM | POA: Diagnosis not present

## 2016-12-23 DIAGNOSIS — H2513 Age-related nuclear cataract, bilateral: Secondary | ICD-10-CM | POA: Diagnosis not present

## 2016-12-23 DIAGNOSIS — H52203 Unspecified astigmatism, bilateral: Secondary | ICD-10-CM | POA: Diagnosis not present

## 2017-08-09 DIAGNOSIS — Z23 Encounter for immunization: Secondary | ICD-10-CM | POA: Diagnosis not present

## 2017-08-27 ENCOUNTER — Ambulatory Visit (INDEPENDENT_AMBULATORY_CARE_PROVIDER_SITE_OTHER): Payer: Medicare Other | Admitting: Internal Medicine

## 2017-08-27 ENCOUNTER — Telehealth: Payer: Self-pay

## 2017-08-27 ENCOUNTER — Other Ambulatory Visit (INDEPENDENT_AMBULATORY_CARE_PROVIDER_SITE_OTHER): Payer: Medicare Other

## 2017-08-27 ENCOUNTER — Other Ambulatory Visit: Payer: Self-pay | Admitting: Internal Medicine

## 2017-08-27 ENCOUNTER — Encounter: Payer: Self-pay | Admitting: Internal Medicine

## 2017-08-27 VITALS — BP 134/88 | HR 73 | Temp 98.5°F | Ht 72.0 in | Wt 228.0 lb

## 2017-08-27 DIAGNOSIS — E785 Hyperlipidemia, unspecified: Secondary | ICD-10-CM

## 2017-08-27 DIAGNOSIS — E039 Hypothyroidism, unspecified: Secondary | ICD-10-CM | POA: Diagnosis not present

## 2017-08-27 DIAGNOSIS — N320 Bladder-neck obstruction: Secondary | ICD-10-CM

## 2017-08-27 DIAGNOSIS — N400 Enlarged prostate without lower urinary tract symptoms: Secondary | ICD-10-CM | POA: Diagnosis not present

## 2017-08-27 DIAGNOSIS — Z0000 Encounter for general adult medical examination without abnormal findings: Secondary | ICD-10-CM | POA: Diagnosis not present

## 2017-08-27 DIAGNOSIS — R972 Elevated prostate specific antigen [PSA]: Secondary | ICD-10-CM

## 2017-08-27 LAB — CBC WITH DIFFERENTIAL/PLATELET
BASOS ABS: 0 10*3/uL (ref 0.0–0.1)
Basophils Relative: 0.4 % (ref 0.0–3.0)
EOS ABS: 0.2 10*3/uL (ref 0.0–0.7)
Eosinophils Relative: 2.7 % (ref 0.0–5.0)
HCT: 49.9 % (ref 39.0–52.0)
Hemoglobin: 16.4 g/dL (ref 13.0–17.0)
LYMPHS ABS: 1.1 10*3/uL (ref 0.7–4.0)
Lymphocytes Relative: 19.9 % (ref 12.0–46.0)
MCHC: 32.9 g/dL (ref 30.0–36.0)
MCV: 93.5 fl (ref 78.0–100.0)
MONO ABS: 0.6 10*3/uL (ref 0.1–1.0)
MONOS PCT: 10.2 % (ref 3.0–12.0)
NEUTROS PCT: 66.8 % (ref 43.0–77.0)
Neutro Abs: 3.8 10*3/uL (ref 1.4–7.7)
PLATELETS: 178 10*3/uL (ref 150.0–400.0)
RBC: 5.34 Mil/uL (ref 4.22–5.81)
RDW: 13.2 % (ref 11.5–15.5)
WBC: 5.7 10*3/uL (ref 4.0–10.5)

## 2017-08-27 LAB — URINALYSIS, ROUTINE W REFLEX MICROSCOPIC
BILIRUBIN URINE: NEGATIVE
HGB URINE DIPSTICK: NEGATIVE
KETONES UR: NEGATIVE
LEUKOCYTES UA: NEGATIVE
NITRITE: NEGATIVE
RBC / HPF: NONE SEEN (ref 0–?)
Specific Gravity, Urine: 1.025 (ref 1.000–1.030)
Total Protein, Urine: NEGATIVE
URINE GLUCOSE: NEGATIVE
UROBILINOGEN UA: 0.2 (ref 0.0–1.0)
pH: 5.5 (ref 5.0–8.0)

## 2017-08-27 LAB — BASIC METABOLIC PANEL
BUN: 16 mg/dL (ref 6–23)
CALCIUM: 9.9 mg/dL (ref 8.4–10.5)
CHLORIDE: 105 meq/L (ref 96–112)
CO2: 31 meq/L (ref 19–32)
CREATININE: 0.94 mg/dL (ref 0.40–1.50)
GFR: 84.5 mL/min (ref >60.00)
GLUCOSE: 103 mg/dL — AB (ref 70–99)
Potassium: 5 mEq/L (ref 3.5–5.1)
Sodium: 141 mEq/L (ref 135–145)

## 2017-08-27 LAB — LIPID PANEL
CHOL/HDL RATIO: 4
Cholesterol: 172 mg/dL (ref 0–200)
HDL: 44.6 mg/dL (ref >39.00)
LDL CALC: 103 mg/dL — AB (ref 0–99)
NONHDL: 126.96
Triglycerides: 118 mg/dL (ref 0.0–149.0)
VLDL: 23.6 mg/dL (ref 0.0–40.0)

## 2017-08-27 LAB — TSH: TSH: 4.87 u[IU]/mL — AB (ref 0.35–4.50)

## 2017-08-27 LAB — HEPATIC FUNCTION PANEL
ALK PHOS: 57 U/L (ref 39–117)
ALT: 19 U/L (ref 0–53)
AST: 22 U/L (ref 0–37)
Albumin: 4.5 g/dL (ref 3.5–5.2)
BILIRUBIN DIRECT: 0.1 mg/dL (ref 0.0–0.3)
BILIRUBIN TOTAL: 0.6 mg/dL (ref 0.2–1.2)
Total Protein: 6.9 g/dL (ref 6.0–8.3)

## 2017-08-27 LAB — PSA: PSA: 4.17 ng/mL — AB (ref 0.10–4.00)

## 2017-08-27 NOTE — Progress Notes (Signed)
Subjective:    Patient ID: Kevin Stein, male    DOB: Aug 19, 1946, 71 y.o.   MRN: [id number]  HPI  Here for yearly f/u;  Overall doing ok;  Pt denies Chest pain, worsening SOB, DOE, wheezing, orthopnea, PND, worsening LE edema, palpitations, dizziness or syncope.  Pt denies neurological change such as new headache, facial or extremity weakness.  Pt denies polydipsia, polyuria, or low sugar symptoms. Pt states overall good compliance with treatment and medications, good tolerability, and has been trying to follow appropriate diet.  Pt denies worsening depressive symptoms, suicidal ideation or panic. No fever, night sweats, wt loss, loss of appetite, or other constitutional symptoms.  Pt states good ability with ADL's, has low fall risk, home safety reviewed and adequate, no other significant changes in hearing or vision, and only occasionally active with exercise.  Denies hyper or hypo thyroid symptoms such as voice, skin or hair change.  Lots 10 lbs with better diet.   Wt Readings from Last 3 Encounters:  08/27/17 228 lb (103.4 kg)  08/26/16 238 lb (108 kg)  11/17/15 237 lb (107.5 kg)   Past Medical History:  Diagnosis Date  . ALLERGIC RHINITIS 07/06/2008   Qualifier: Diagnosis of  By: Jenny Reichmann MD, [facility] Oris   . BENIGN PROSTATIC HYPERTROPHY 07/06/2008   Qualifier: Diagnosis of  By: Jenny Reichmann MD, [place], HX OF 07/06/2008   Qualifier: Diagnosis of  By: Jenny Reichmann MD, [place], RIGHT SHOULDER 07/06/2008   Qualifier: Diagnosis of  By: Jenny Reichmann MD, [facility] Oris   . DIVERTICULOSIS, COLON 07/06/2008   Qualifier: Diagnosis of  By: Jenny Reichmann MD, Hunt Oris   . HYPERLIPIDEMIA 07/06/2008   Qualifier: Diagnosis of  By: Jenny Reichmann MD, Hunt Oris   . HYPOTHYROIDISM 07/06/2008   Qualifier: Diagnosis of  By: Jenny Reichmann MD, Hunt Oris   . Peripheral vascular disease ([place])   . Varices of other sites 12/21/2008   Qualifier: Diagnosis of  By: Jenny Reichmann MD, Hunt Oris    Past Surgical History:  Procedure Laterality  Date  . AXILLARY LYMPH NODE DISSECTION     left     1978   . COLONOSCOPY    . left middle ear surgury    . right shoulder surgury  2010   Dr Veverly Fells  . TRANSFORAMINAL LUMBAR INTERBODY FUSION (TLIF) WITH PEDICLE SCREW FIXATION 1 LEVEL    . VARICOSE VEIN SURGERY    . VASECTOMY      reports that  has never smoked. he has never used smokeless tobacco. He reports that he drinks alcohol. He reports that he does not use drugs. family history includes Cancer in his father; Diabetes in his mother; Hypertension in his mother. No Known Allergies Current Outpatient Medications on File Prior to Visit  Medication Sig Dispense Refill  . aspirin EC 81 MG tablet Take 81 mg by mouth daily.    Marland Kitchen levothyroxine (SYNTHROID, LEVOTHROID) 50 MCG tablet Take 1 tablet (50 mcg total) by mouth daily. 90 tablet 3  . OVER THE COUNTER MEDICATION Fiber    . pravastatin (PRAVACHOL) 40 MG tablet Take 1 tablet (40 mg total) by mouth daily. 90 tablet 3  . vitamin C (ASCORBIC ACID) 500 MG tablet Take 500 mg by mouth daily.     No current facility-administered medications on file prior to visit.    Review of Systems  Constitutional: Negative for other unusual diaphoresis or sweats HENT: Negative for ear discharge or swelling  Eyes: Negative for other worsening visual disturbances Respiratory: Negative for stridor or other swelling  Gastrointestinal: Negative for worsening distension or other blood Genitourinary: Negative for retention or other urinary change Musculoskeletal: Negative for other MSK pain or swelling Skin: Negative for color change or other new lesions Neurological: Negative for worsening tremors and other numbness  Psychiatric/Behavioral: Negative for worsening agitation or other fatigue All other system neg per pt    Objective:   Physical Exam BP 134/88   Pulse 73   Temp 98.5 F (36.9 C)   Ht 6' (1.829 m)   Wt 228 lb (103.4 kg)   SpO2 99%   BMI 30.92 kg/m  VS noted,  Constitutional: Pt  appears in NAD HENT: Head: NCAT.  Right Ear: External ear normal.  Left Ear: External ear normal.  Eyes: . Pupils are equal, round, and reactive to light. Conjunctivae and EOM are normal Nose: without d/c or deformity Neck: Neck supple. Gross normal ROM Cardiovascular: Normal rate and regular rhythm.   Pulmonary/Chest: Effort normal and breath sounds without rales or wheezing.  Abd:  Soft, NT, ND, + BS, no organomegaly Neurological: Pt is alert. At baseline orientation, motor grossly intact Skin: Skin is warm. No rashes, other new lesions, no LE edema Psychiatric: Pt behavior is normal without agitation  No other exam findings    Assessment & Plan:

## 2017-08-27 NOTE — Patient Instructions (Signed)

## 2017-08-27 NOTE — Telephone Encounter (Signed)
-----   Message from Biagio Borg, MD sent at 08/27/2017  1:19 PM EST ----- Left message on MyChart, pt to cont same tx except  The test results show that your current treatment is OK, except the PSA is quite high compared to the previous.  This is concerning, and means we should refer you to Urology for further consideration.  You should hear from the office as well.Kevin Stein to please inform pt, I will do referral

## 2017-08-27 NOTE — Telephone Encounter (Signed)
Pt has been informed and expressed understanding.  

## 2017-08-30 NOTE — Assessment & Plan Note (Addendum)
stable overall by history and exam, recent data reviewed with pt, and pt to continue medical treatment as before,  to f/u any worsening symptoms or concerns, for f/u lab, goal LDL < 100

## 2017-08-30 NOTE — Assessment & Plan Note (Signed)
stable overall by history and exam, recent data reviewed with pt, and pt to continue medical treatment as before,  to f/u any worsening symptoms or concerns Lab Results  Component Value Date   TSH 4.87 (H) 08/27/2017

## 2017-08-30 NOTE — Assessment & Plan Note (Signed)
stable overall by history and exam, and pt to continue medical treatment as before,  to f/u any worsening symptoms or concerns 

## 2017-10-28 DIAGNOSIS — R972 Elevated prostate specific antigen [PSA]: Secondary | ICD-10-CM | POA: Diagnosis not present

## 2017-10-28 DIAGNOSIS — R351 Nocturia: Secondary | ICD-10-CM | POA: Diagnosis not present

## 2017-10-28 DIAGNOSIS — N401 Enlarged prostate with lower urinary tract symptoms: Secondary | ICD-10-CM | POA: Diagnosis not present

## 2017-10-29 DIAGNOSIS — R972 Elevated prostate specific antigen [PSA]: Secondary | ICD-10-CM | POA: Diagnosis not present

## 2017-11-04 ENCOUNTER — Other Ambulatory Visit: Payer: Self-pay | Admitting: Internal Medicine

## 2018-07-26 DIAGNOSIS — Z23 Encounter for immunization: Secondary | ICD-10-CM | POA: Diagnosis not present

## 2018-08-28 ENCOUNTER — Ambulatory Visit (INDEPENDENT_AMBULATORY_CARE_PROVIDER_SITE_OTHER): Payer: Medicare Other | Admitting: Internal Medicine

## 2018-08-28 ENCOUNTER — Other Ambulatory Visit (INDEPENDENT_AMBULATORY_CARE_PROVIDER_SITE_OTHER): Payer: Medicare Other

## 2018-08-28 ENCOUNTER — Encounter: Payer: Self-pay | Admitting: Internal Medicine

## 2018-08-28 VITALS — BP 114/72 | HR 74 | Temp 98.0°F | Ht 72.0 in | Wt 229.0 lb

## 2018-08-28 DIAGNOSIS — J309 Allergic rhinitis, unspecified: Secondary | ICD-10-CM

## 2018-08-28 DIAGNOSIS — E785 Hyperlipidemia, unspecified: Secondary | ICD-10-CM

## 2018-08-28 DIAGNOSIS — N320 Bladder-neck obstruction: Secondary | ICD-10-CM

## 2018-08-28 DIAGNOSIS — E039 Hypothyroidism, unspecified: Secondary | ICD-10-CM | POA: Diagnosis not present

## 2018-08-28 DIAGNOSIS — R739 Hyperglycemia, unspecified: Secondary | ICD-10-CM | POA: Diagnosis not present

## 2018-08-28 LAB — CBC WITH DIFFERENTIAL/PLATELET
BASOS PCT: 0.5 % (ref 0.0–3.0)
Basophils Absolute: 0 10*3/uL (ref 0.0–0.1)
Eosinophils Absolute: 0.1 10*3/uL (ref 0.0–0.7)
Eosinophils Relative: 2.4 % (ref 0.0–5.0)
HCT: 48.1 % (ref 39.0–52.0)
HEMOGLOBIN: 16.3 g/dL (ref 13.0–17.0)
LYMPHS ABS: 1.1 10*3/uL (ref 0.7–4.0)
Lymphocytes Relative: 20.7 % (ref 12.0–46.0)
MCHC: 33.9 g/dL (ref 30.0–36.0)
MCV: 91.6 fl (ref 78.0–100.0)
MONO ABS: 0.6 10*3/uL (ref 0.1–1.0)
Monocytes Relative: 11.6 % (ref 3.0–12.0)
NEUTROS ABS: 3.6 10*3/uL (ref 1.4–7.7)
Neutrophils Relative %: 64.8 % (ref 43.0–77.0)
PLATELETS: 205 10*3/uL (ref 150.0–400.0)
RBC: 5.25 Mil/uL (ref 4.22–5.81)
RDW: 13.4 % (ref 11.5–15.5)
WBC: 5.5 10*3/uL (ref 4.0–10.5)

## 2018-08-28 LAB — HEPATIC FUNCTION PANEL
ALT: 21 U/L (ref 0–53)
AST: 21 U/L (ref 0–37)
Albumin: 4.7 g/dL (ref 3.5–5.2)
Alkaline Phosphatase: 52 U/L (ref 39–117)
BILIRUBIN TOTAL: 0.6 mg/dL (ref 0.2–1.2)
Bilirubin, Direct: 0.1 mg/dL (ref 0.0–0.3)
Total Protein: 7.2 g/dL (ref 6.0–8.3)

## 2018-08-28 LAB — TSH: TSH: 6.25 u[IU]/mL — ABNORMAL HIGH (ref 0.35–4.50)

## 2018-08-28 LAB — PSA: PSA: 0.68 ng/mL (ref 0.10–4.00)

## 2018-08-28 LAB — LIPID PANEL
CHOLESTEROL: 191 mg/dL (ref 0–200)
HDL: 46 mg/dL (ref >39.00)
LDL Cholesterol: 120 mg/dL — ABNORMAL HIGH (ref 0–99)
NONHDL: 145.13
Total CHOL/HDL Ratio: 4
Triglycerides: 126 mg/dL (ref 0.0–149.0)
VLDL: 25.2 mg/dL (ref 0.0–40.0)

## 2018-08-28 LAB — URINALYSIS, ROUTINE W REFLEX MICROSCOPIC
Bilirubin Urine: NEGATIVE
Hgb urine dipstick: NEGATIVE
KETONES UR: NEGATIVE
LEUKOCYTES UA: NEGATIVE
Nitrite: NEGATIVE
PH: 6 (ref 5.0–8.0)
RBC / HPF: NONE SEEN (ref 0–?)
Specific Gravity, Urine: 1.02 (ref 1.000–1.030)
TOTAL PROTEIN, URINE-UPE24: NEGATIVE
URINE GLUCOSE: NEGATIVE
UROBILINOGEN UA: 0.2 (ref 0.0–1.0)
WBC, UA: NONE SEEN (ref 0–?)

## 2018-08-28 LAB — BASIC METABOLIC PANEL
BUN: 19 mg/dL (ref 6–23)
CO2: 29 mEq/L (ref 19–32)
Calcium: 9.5 mg/dL (ref 8.4–10.5)
Chloride: 104 mEq/L (ref 96–112)
Creatinine, Ser: 0.9 mg/dL (ref 0.40–1.50)
GFR: 88.59 mL/min (ref >60.00)
GLUCOSE: 94 mg/dL (ref 70–99)
POTASSIUM: 4.7 meq/L (ref 3.5–5.1)
SODIUM: 140 meq/L (ref 135–145)

## 2018-08-28 LAB — HEMOGLOBIN A1C: Hgb A1c MFr Bld: 6 % (ref 4.6–6.5)

## 2018-08-28 MED ORDER — ZOSTER VAC RECOMB ADJUVANTED 50 MCG/0.5ML IM SUSR: 0.5000 mL | Freq: Once | INTRAMUSCULAR | 1 refills | Status: AC

## 2018-08-28 MED ORDER — LEVOTHYROXINE SODIUM 50 MCG PO TABS: 50.0000 ug | ORAL_TABLET | Freq: Every day | ORAL | 3 refills | Status: DC

## 2018-08-28 MED ORDER — PRAVASTATIN SODIUM 40 MG PO TABS: 40.0000 mg | ORAL_TABLET | Freq: Every day | ORAL | 3 refills | Status: DC

## 2018-08-28 NOTE — Assessment & Plan Note (Signed)
stable overall by history and exam, recent data reviewed with pt, and pt to continue medical treatment as before,  to f/u any worsening symptoms or concerns, for f/u a1c with labs 

## 2018-08-28 NOTE — Patient Instructions (Addendum)
Your shingles shot prescription was sent to the pharmacy  Please continue all other medications as before, and refills have been done if requested.  Please have the pharmacy call with any other refills you may need.  Please continue your efforts at being more active, low cholesterol diet, and weight control.  You are otherwise up to date with prevention measures today.  Please keep your appointments with your specialists as you may have planned  Please go to the LAB in the Basement (turn left off the elevator) for the tests to be done today  You will be contacted by phone if any changes need to be made immediately.  Otherwise, you will receive a letter about your results with an explanation, but please check with MyChart first.  Please remember to sign up for MyChart if you have not done so, as this will be important to you in the future with finding out test results, communicating by private email, and scheduling acute appointments online when needed.  Please return in 1 year for your yearly visit, or sooner if needed

## 2018-08-28 NOTE — Progress Notes (Signed)
Subjective:    Patient ID: Kevin Stein, male    DOB: 29-Oct-1945, 72 y.o.   MRN: [id number]  HPI  Here for yearly f/u;  Overall doing ok;  Pt denies Chest pain, worsening SOB, DOE, wheezing, orthopnea, PND, worsening LE edema, palpitations, dizziness or syncope.  Pt denies neurological change such as new headache, facial or extremity weakness.  Pt denies polydipsia, polyuria, or low sugar symptoms. Pt states overall good compliance with treatment and medications, good tolerability, and has been trying to follow appropriate diet.  Pt denies worsening depressive symptoms, suicidal ideation or panic. No fever, night sweats, wt loss, loss of appetite, or other constitutional symptoms.  Pt states good ability with ADL's, has low fall risk, home safety reviewed and adequate, no other significant changes in hearing or vision, and only occasionally active with exercise. Does have several wks ongoing nasal allergy symptoms with clearish congestion, itch and sneezing, without fever, pain, ST, cough, swelling or wheezing. Denies hyper or hypo thyroid symptoms such as voice, skin or hair change.  Did see Urology with PSA back down, so no biopsy.  No new complaints Past Medical History:  Diagnosis Date  . ALLERGIC RHINITIS 07/06/2008   Qualifier: Diagnosis of  By: Jenny Reichmann MD, [facility] Oris   . BENIGN PROSTATIC HYPERTROPHY 07/06/2008   Qualifier: Diagnosis of  By: Jenny Reichmann MD, [place], HX OF 07/06/2008   Qualifier: Diagnosis of  By: Jenny Reichmann MD, [place], RIGHT SHOULDER 07/06/2008   Qualifier: Diagnosis of  By: Jenny Reichmann MD, Hunt Oris   . DIVERTICULOSIS, COLON 07/06/2008   Qualifier: Diagnosis of  By: Jenny Reichmann MD, Hunt Oris   . HYPERLIPIDEMIA 07/06/2008   Qualifier: Diagnosis of  By: Jenny Reichmann MD, Hunt Oris   . HYPOTHYROIDISM 07/06/2008   Qualifier: Diagnosis of  By: Jenny Reichmann MD, Hunt Oris   . Peripheral vascular disease ([place])   . Varices of other sites 12/21/2008   Qualifier: Diagnosis of  By: Jenny Reichmann  MD, [facility] Oris    Past Surgical History:  Procedure Laterality Date  . AXILLARY LYMPH NODE DISSECTION     left     1978   . COLONOSCOPY    . left middle ear surgury    . right shoulder surgury  2010   Dr Veverly Fells  . TRANSFORAMINAL LUMBAR INTERBODY FUSION (TLIF) WITH PEDICLE SCREW FIXATION 1 LEVEL    . VARICOSE VEIN SURGERY    . VASECTOMY      reports that he has never smoked. He has never used smokeless tobacco. He reports that he drinks alcohol. He reports that he does not use drugs. family history includes Cancer in his father; Diabetes in his mother; Hypertension in his mother. No Known Allergies  Review of Systems  Constitutional: Negative for other unusual diaphoresis or sweats HENT: Negative for ear discharge or swelling Eyes: Negative for other worsening visual disturbances Respiratory: Negative for stridor or other swelling  Gastrointestinal: Negative for worsening distension or other blood Genitourinary: Negative for retention or other urinary change Musculoskeletal: Negative for other MSK pain or swelling Skin: Negative for color change or other new lesions Neurological: Negative for worsening tremors and other numbness  Psychiatric/Behavioral: Negative for worsening agitation or other fatigue All other system neg per pt    Objective:   Physical Exam BP 114/72   Pulse 74   Temp 98 F (36.7 C) (Oral)   Ht 6' (1.829 m)   Wt 229 lb (103.9  kg)   SpO2 98%   BMI 31.06 kg/m  VS noted,  Constitutional: Pt appears in NAD HENT: Head: NCAT.  Right Ear: External ear normal.  Left Ear: External ear normal.  Eyes: . Pupils are equal, round, and reactive to light. Conjunctivae and EOM are normal Nose: without d/c or deformity Neck: Neck supple. Gross normal ROM Cardiovascular: Normal rate and regular rhythm.   Pulmonary/Chest: Effort normal and breath sounds without rales or wheezing.  Abd:  Soft, NT, ND, + BS, no organomegaly Neurological: Pt is alert. At baseline  orientation, motor grossly intact Skin: Skin is warm. No rashes, other new lesions, no LE edema Psychiatric: Pt behavior is normal without agitation  No other exam findings Lab Results  Component Value Date   WBC 5.7 08/27/2017   HGB 16.4 08/27/2017   HCT 49.9 08/27/2017   PLT 178.0 08/27/2017   GLUCOSE 103 (H) 08/27/2017   CHOL 172 08/27/2017   TRIG 118.0 08/27/2017   HDL 44.60 08/27/2017   LDLDIRECT 155.6 09/25/2009   LDLCALC 103 (H) 08/27/2017   ALT 19 08/27/2017   AST 22 08/27/2017   NA 141 08/27/2017   K 5.0 08/27/2017   CL 105 08/27/2017   CREATININE 0.94 08/27/2017   BUN 16 08/27/2017   CO2 31 08/27/2017   TSH 4.87 (H) 08/27/2017   PSA 4.17 (H) 08/27/2017   INR 1.0 10/26/2008       Assessment & Plan:

## 2018-08-28 NOTE — Assessment & Plan Note (Signed)
[place] for OTC nasacort asd,  to f/u any worsening symptoms or concerns

## 2018-08-28 NOTE — Assessment & Plan Note (Signed)
stable overall by history and exam, recent data reviewed with pt, and pt to continue medical treatment as before,  to f/u any worsening symptoms or concerns Lab Results  Component Value Date   LDLCALC 103 (H) 08/27/2017

## 2018-08-28 NOTE — Assessment & Plan Note (Signed)
stable overall by history and exam, recent data reviewed with pt, and pt to continue medical treatment as before,  to f/u any worsening symptoms or concerns, for f/u lab 

## 2018-08-30 ENCOUNTER — Other Ambulatory Visit: Payer: Self-pay | Admitting: Internal Medicine

## 2018-08-30 MED ORDER — LEVOTHYROXINE SODIUM 75 MCG PO TABS: 75.0000 ug | ORAL_TABLET | Freq: Every day | ORAL | 3 refills | Status: DC

## 2018-08-30 MED ORDER — PRAVASTATIN SODIUM 80 MG PO TABS: 80.0000 mg | ORAL_TABLET | Freq: Every day | ORAL | 3 refills | Status: DC

## 2018-08-31 ENCOUNTER — Telehealth: Payer: Self-pay

## 2018-08-31 NOTE — Telephone Encounter (Signed)
-----   Message from Biagio Borg, MD sent at 08/30/2018 12:42 PM EST ----- Left message on MyChart, pt to cont same tx except  The test results show that your current treatment is OK, except you still have mild low thyroid, and mild elevated cholesterol.  We should increase your thyroid medication from 50 to 75 mcg per day, and we should increase the pravastatin to 80 mg per day.  I will send the prescriptions, and you should hear from the office as well.    Shirron to please inform pt, I will do rx x 2

## 2018-08-31 NOTE — Telephone Encounter (Signed)
Pt has viewed results via MyChart  

## 2018-11-22 ENCOUNTER — Other Ambulatory Visit: Payer: Self-pay | Admitting: Internal Medicine

## 2019-07-10 ENCOUNTER — Other Ambulatory Visit: Payer: Self-pay | Admitting: Internal Medicine

## 2019-07-26 DIAGNOSIS — Z23 Encounter for immunization: Secondary | ICD-10-CM | POA: Diagnosis not present

## 2019-09-01 ENCOUNTER — Ambulatory Visit: Payer: Medicare Other | Admitting: Internal Medicine

## 2019-12-03 DIAGNOSIS — Z23 Encounter for immunization: Secondary | ICD-10-CM | POA: Diagnosis not present

## 2019-12-30 ENCOUNTER — Other Ambulatory Visit: Payer: Self-pay | Admitting: Internal Medicine

## 2019-12-31 DIAGNOSIS — Z23 Encounter for immunization: Secondary | ICD-10-CM | POA: Diagnosis not present

## 2020-01-20 ENCOUNTER — Ambulatory Visit: Payer: Medicare Other | Admitting: Internal Medicine

## 2020-01-26 ENCOUNTER — Other Ambulatory Visit: Payer: Self-pay

## 2020-01-26 ENCOUNTER — Encounter: Payer: Self-pay | Admitting: Internal Medicine

## 2020-01-26 ENCOUNTER — Ambulatory Visit (INDEPENDENT_AMBULATORY_CARE_PROVIDER_SITE_OTHER): Payer: Medicare Other | Admitting: Internal Medicine

## 2020-01-26 VITALS — BP 146/82 | HR 66 | Temp 97.8°F | Ht 72.0 in | Wt 223.8 lb

## 2020-01-26 DIAGNOSIS — E559 Vitamin D deficiency, unspecified: Secondary | ICD-10-CM | POA: Diagnosis not present

## 2020-01-26 DIAGNOSIS — N320 Bladder-neck obstruction: Secondary | ICD-10-CM | POA: Diagnosis not present

## 2020-01-26 DIAGNOSIS — R739 Hyperglycemia, unspecified: Secondary | ICD-10-CM | POA: Diagnosis not present

## 2020-01-26 DIAGNOSIS — E039 Hypothyroidism, unspecified: Secondary | ICD-10-CM | POA: Diagnosis not present

## 2020-01-26 DIAGNOSIS — E611 Iron deficiency: Secondary | ICD-10-CM | POA: Diagnosis not present

## 2020-01-26 DIAGNOSIS — R350 Frequency of micturition: Secondary | ICD-10-CM | POA: Insufficient documentation

## 2020-01-26 DIAGNOSIS — E785 Hyperlipidemia, unspecified: Secondary | ICD-10-CM | POA: Diagnosis not present

## 2020-01-26 DIAGNOSIS — E538 Deficiency of other specified B group vitamins: Secondary | ICD-10-CM | POA: Diagnosis not present

## 2020-01-26 LAB — CBC WITH DIFFERENTIAL/PLATELET
Basophils Absolute: 0 10*3/uL (ref 0.0–0.1)
Basophils Relative: 0.4 % (ref 0.0–3.0)
Eosinophils Absolute: 0.2 10*3/uL (ref 0.0–0.7)
Eosinophils Relative: 3 % (ref 0.0–5.0)
HCT: 46.3 % (ref 39.0–52.0)
Hemoglobin: 15.7 g/dL (ref 13.0–17.0)
Lymphocytes Relative: 22.3 % (ref 12.0–46.0)
Lymphs Abs: 1.3 10*3/uL (ref 0.7–4.0)
MCHC: 33.9 g/dL (ref 30.0–36.0)
MCV: 92.4 fl (ref 78.0–100.0)
Monocytes Absolute: 0.7 10*3/uL (ref 0.1–1.0)
Monocytes Relative: 11.4 % (ref 3.0–12.0)
Neutro Abs: 3.7 10*3/uL (ref 1.4–7.7)
Neutrophils Relative %: 62.9 % (ref 43.0–77.0)
Platelets: 165 10*3/uL (ref 150.0–400.0)
RBC: 5.02 Mil/uL (ref 4.22–5.81)
RDW: 13.2 % (ref 11.5–15.5)
WBC: 5.8 10*3/uL (ref 4.0–10.5)

## 2020-01-26 LAB — HEPATIC FUNCTION PANEL
ALT: 25 U/L (ref 0–53)
AST: 23 U/L (ref 0–37)
Albumin: 4.5 g/dL (ref 3.5–5.2)
Alkaline Phosphatase: 57 U/L (ref 39–117)
Bilirubin, Direct: 0.1 mg/dL (ref 0.0–0.3)
Total Bilirubin: 0.7 mg/dL (ref 0.2–1.2)
Total Protein: 6.7 g/dL (ref 6.0–8.3)

## 2020-01-26 LAB — IBC PANEL
Iron: 116 ug/dL (ref 42–165)
Saturation Ratios: 40.8 % (ref 20.0–50.0)
Transferrin: 203 mg/dL — ABNORMAL LOW (ref 212.0–360.0)

## 2020-01-26 LAB — TSH: TSH: 3.7 u[IU]/mL (ref 0.35–4.50)

## 2020-01-26 LAB — BASIC METABOLIC PANEL
BUN: 20 mg/dL (ref 6–23)
CO2: 31 mEq/L (ref 19–32)
Calcium: 9.6 mg/dL (ref 8.4–10.5)
Chloride: 106 mEq/L (ref 96–112)
Creatinine, Ser: 0.87 mg/dL (ref 0.40–1.50)
GFR: 86.33 mL/min (ref >60.00)
Glucose, Bld: 92 mg/dL (ref 70–99)
Potassium: 4.4 mEq/L (ref 3.5–5.1)
Sodium: 142 mEq/L (ref 135–145)

## 2020-01-26 LAB — URINALYSIS, ROUTINE W REFLEX MICROSCOPIC
Bilirubin Urine: NEGATIVE
Hgb urine dipstick: NEGATIVE
Ketones, ur: NEGATIVE
Leukocytes,Ua: NEGATIVE
Nitrite: NEGATIVE
RBC / HPF: NONE SEEN (ref 0–?)
Specific Gravity, Urine: >=1.03 — AB (ref 1.000–1.030)
Total Protein, Urine: NEGATIVE
Urine Glucose: NEGATIVE
Urobilinogen, UA: 0.2 (ref 0.0–1.0)
pH: 5.5 (ref 5.0–8.0)

## 2020-01-26 LAB — LIPID PANEL
Cholesterol: 172 mg/dL (ref 0–200)
HDL: 50.3 mg/dL (ref >39.00)
LDL Cholesterol: 103 mg/dL — ABNORMAL HIGH (ref 0–99)
NonHDL: 122.18
Total CHOL/HDL Ratio: 3
Triglycerides: 94 mg/dL (ref 0.0–149.0)
VLDL: 18.8 mg/dL (ref 0.0–40.0)

## 2020-01-26 LAB — VITAMIN D 25 HYDROXY (VIT D DEFICIENCY, FRACTURES): VITD: 37.23 ng/mL (ref 30.00–100.00)

## 2020-01-26 LAB — PSA: PSA: 0.42 ng/mL (ref 0.10–4.00)

## 2020-01-26 LAB — HEMOGLOBIN A1C: Hgb A1c MFr Bld: 5.9 % (ref 4.6–6.5)

## 2020-01-26 LAB — VITAMIN B12: Vitamin B-12: 508 pg/mL (ref 211–911)

## 2020-01-26 MED ORDER — PRAVASTATIN SODIUM 80 MG PO TABS: 80.0000 mg | ORAL_TABLET | Freq: Every day | ORAL | 11 refills | Status: DC

## 2020-01-26 MED ORDER — LEVOTHYROXINE SODIUM 50 MCG PO TABS: ORAL_TABLET | ORAL | 11 refills | Status: DC

## 2020-01-26 NOTE — Progress Notes (Signed)
Subjective:    Patient ID: Kevin Stein, male    DOB: December 18, 1945, 74 y.o.   MRN: [id number]  HPI  Here for yearly f/u;  Overall doing ok;  Pt denies Chest pain, worsening SOB, DOE, wheezing, orthopnea, PND, worsening LE edema, palpitations, dizziness or syncope.  Pt denies neurological change such as new headache, facial or extremity weakness.  Pt denies polydipsia, polyuria, or low sugar symptoms. Pt states overall good compliance with treatment and medications, good tolerability, and has been trying to follow appropriate diet.  Pt denies worsening depressive symptoms, suicidal ideation or panic. No fever, night sweats, wt loss, loss of appetite, or other constitutional symptoms.  Pt states good ability with ADL's, has low fall risk, home safety reviewed and adequate, no other significant changes in hearing or vision, and only occasionally active with exercise.  Denies urinary symptoms such as dysuria, urgency, flank pain, hematuria or n/v, fever, chills, but does have mild urinary frequency and nocturia 3 x per night for over 1 yr Past Medical History:  Diagnosis Date  . ALLERGIC RHINITIS 07/06/2008   Qualifier: Diagnosis of  By: Jenny Reichmann MD, Hunt Oris   . BENIGN PROSTATIC HYPERTROPHY 07/06/2008   Qualifier: Diagnosis of  By: Jenny Reichmann MD, [place], HX OF 07/06/2008   Qualifier: Diagnosis of  By: Jenny Reichmann MD, [place], RIGHT SHOULDER 07/06/2008   Qualifier: Diagnosis of  By: Jenny Reichmann MD, Hunt Oris   . DIVERTICULOSIS, COLON 07/06/2008   Qualifier: Diagnosis of  By: Jenny Reichmann MD, Hunt Oris   . HYPERLIPIDEMIA 07/06/2008   Qualifier: Diagnosis of  By: Jenny Reichmann MD, Hunt Oris   . HYPOTHYROIDISM 07/06/2008   Qualifier: Diagnosis of  By: Jenny Reichmann MD, Hunt Oris   . Peripheral vascular disease ([place])   . Varices of other sites 12/21/2008   Qualifier: Diagnosis of  By: Jenny Reichmann MD, Hunt Oris    Past Surgical History:  Procedure Laterality Date  . AXILLARY LYMPH NODE DISSECTION     left     1978     . COLONOSCOPY    . left middle ear surgury    . right shoulder surgury  2010   Dr Veverly Fells  . TRANSFORAMINAL LUMBAR INTERBODY FUSION (TLIF) WITH PEDICLE SCREW FIXATION 1 LEVEL    . VARICOSE VEIN SURGERY    . VASECTOMY      reports that he has never smoked. He has never used smokeless tobacco. He reports current alcohol use. He reports that he does not use drugs. family history includes Cancer in his father; Diabetes in his mother; Hypertension in his mother. No Known Allergies Current Outpatient Medications on File Prior to Visit  Medication Sig Dispense Refill  . aspirin EC 81 MG tablet Take 81 mg by mouth daily.    Marland Kitchen levothyroxine (SYNTHROID, LEVOTHROID) 75 MCG tablet Take 1 tablet (75 mcg total) by mouth daily. 90 tablet 3  . OVER THE COUNTER MEDICATION Fiber     No current facility-administered medications on file prior to visit.   Review of Systems All otherwise neg per pt    Objective:   Physical Exam BP (!) 146/82   Pulse 66   Temp 97.8 F (36.6 C)   Ht 6' (1.829 m)   Wt 223 lb 12.8 oz (101.5 kg)   SpO2 100%   BMI 30.35 kg/m  VS noted,  Constitutional: Pt appears in NAD HENT: Head: NCAT.  Right Ear: External ear normal.  Left  Ear: External ear normal.  Eyes: . Pupils are equal, round, and reactive to light. Conjunctivae and EOM are normal Nose: without d/c or deformity Neck: Neck supple. Gross normal ROM Cardiovascular: Normal rate and regular rhythm.   Pulmonary/Chest: Effort normal and breath sounds without rales or wheezing.  Abd:  Soft, NT, ND, + BS, no organomegaly Neurological: Pt is alert. At baseline orientation, motor grossly intact Skin: Skin is warm. No rashes, other new lesions, no LE edema Psychiatric: Pt behavior is normal without agitation  All otherwise neg per pt Lab Results  Component Value Date   WBC 5.8 01/26/2020   HGB 15.7 01/26/2020   HCT 46.3 01/26/2020   PLT 165.0 01/26/2020   GLUCOSE 92 01/26/2020   CHOL 172 01/26/2020   TRIG  94.0 01/26/2020   HDL 50.30 01/26/2020   LDLDIRECT 155.6 09/25/2009   LDLCALC 103 (H) 01/26/2020   ALT 25 01/26/2020   AST 23 01/26/2020   NA 142 01/26/2020   K 4.4 01/26/2020   CL 106 01/26/2020   CREATININE 0.87 01/26/2020   BUN 20 01/26/2020   CO2 31 01/26/2020   TSH 3.70 01/26/2020   PSA 0.42 01/26/2020   INR 1.0 10/26/2008   HGBA1C 5.9 01/26/2020      Assessment & Plan:

## 2020-01-26 NOTE — Assessment & Plan Note (Signed)
stable overall by history and exam, recent data reviewed with pt, and pt to continue medical treatment as before,  to f/u any worsening symptoms or concerns  

## 2020-01-26 NOTE — Patient Instructions (Signed)
Please continue all other medications as before, and refills have been done if requested - [facility]  Please let us know if you eventually would want your 90 day scripts to the mail in pharmacy  Please have the pharmacy call with any other refills you may need.  Please continue your efforts at being more active, low cholesterol diet, and weight control.  You are otherwise up to date with prevention measures today.  Please keep your appointments with your specialists as you may have planned  Please go to the LAB at the blood drawing area for the tests to be done  You will be contacted by phone if any changes need to be made immediately.  Otherwise, you will receive a letter about your results with an explanation, but please check with MyChart first.  Please remember to sign up for MyChart if you have not done so, as this will be important to you in the future with finding out test results, communicating by private email, and scheduling acute appointments online when needed.  Please make an Appointment to return for your 1 year visit, or sooner if needed

## 2020-01-26 NOTE — Assessment & Plan Note (Addendum)
?   [facility] - for ua, declines vesicare or urology  I spent 31 minutes in preparing to see the patient by review of recent labs, imaging and procedures, obtaining and reviewing separately obtained history, communicating with the patient and family or caregiver, ordering medications, tests or procedures, and documenting clinical information in the EHR including the differential Dx, treatment, and any further evaluation and other management of urinary freq, hypothyroidism, HLD, hyperglycemia

## 2020-01-27 ENCOUNTER — Telehealth: Payer: Self-pay | Admitting: Internal Medicine

## 2020-01-27 NOTE — Telephone Encounter (Signed)
° ° °  Pt c/o medication issue:  1. Name of Medication: levothyroxine (SYNTHROID, LEVOTHROID)   2. How are you currently taking this medication (dosage and times per day)? As written  3. Are you having a reaction (difficulty breathing--STAT)? no  4. What is your medication issue? Patient calling to clarify dosage. Please call

## 2020-01-27 NOTE — Telephone Encounter (Signed)
Script sent in on yesterday.

## 2020-01-31 ENCOUNTER — Telehealth: Payer: Self-pay

## 2020-01-31 MED ORDER — LEVOTHYROXINE SODIUM 75 MCG PO TABS: 75.0000 ug | ORAL_TABLET | Freq: Every day | ORAL | 3 refills | Status: DC

## 2020-01-31 MED ORDER — PRAVASTATIN SODIUM 80 MG PO TABS: 80.0000 mg | ORAL_TABLET | Freq: Every day | ORAL | 3 refills | Status: DC

## 2020-01-31 NOTE — Telephone Encounter (Signed)
1.Medication Requested:  levothyroxine (SYNTHROID, LEVOTHROID) 75 MCG tablet- 90 days with 3 refills   pravastatin (PRAVACHOL) 80 MG tablet 90 days with 3 refills    2. Pharmacy (Name, Street, City):[id number] [place], [place] [address]  3. On Med List: Yes   4. Last Visit with PCP: 4.7.21   5. Next visit date with PCP: no appt is made at this time     Agent: Please be advised that RX refills may take up to 3 business days. We ask that you follow-up with your pharmacy.

## 2020-01-31 NOTE — Telephone Encounter (Signed)
Reviewed chart pt is up-to-date [facility]  

## 2020-03-23 DIAGNOSIS — H2513 Age-related nuclear cataract, bilateral: Secondary | ICD-10-CM | POA: Diagnosis not present

## 2020-08-29 DIAGNOSIS — Z23 Encounter for immunization: Secondary | ICD-10-CM | POA: Diagnosis not present

## 2020-10-23 ENCOUNTER — Telehealth: Payer: Self-pay | Admitting: Internal Medicine

## 2020-10-23 NOTE — Telephone Encounter (Signed)
Copied from CRM [id number]. Topic: Medicare AWV >> Oct 23, 2020 12:27 PM Claudette Laws R wrote: Reason for CRM:   No answer unable to leave message for patient to call back and schedule Medicare Annual Wellness Visit (AWV) in office.   If not able to come in office, please offer to do virtually.   45 Minute appointment  Last AWV 08/28/2017  Please schedule at anytime with the Nurse Health Advisor.

## 2021-01-20 ENCOUNTER — Other Ambulatory Visit: Payer: Self-pay | Admitting: Internal Medicine

## 2021-01-20 NOTE — Telephone Encounter (Signed)
Please refill as per office routine med refill policy (all routine meds refilled for 3 mo or monthly per pt preference up to one year from last visit, then month to month grace period for 3 mo, then further med refills will have to be denied)  

## 2021-01-29 ENCOUNTER — Encounter: Payer: Self-pay | Admitting: Internal Medicine

## 2021-03-26 DIAGNOSIS — H2513 Age-related nuclear cataract, bilateral: Secondary | ICD-10-CM | POA: Diagnosis not present

## 2021-04-21 ENCOUNTER — Other Ambulatory Visit: Payer: Self-pay | Admitting: Internal Medicine

## 2021-04-21 NOTE — Telephone Encounter (Signed)
Please refill as per office routine med refill policy (all routine meds refilled for 3 mo or monthly per pt preference up to one year from last visit, then month to month grace period for 3 mo, then further med refills will have to be denied)  

## 2021-04-26 NOTE — Telephone Encounter (Signed)
LVM instructing pt to call office to schedule appt with PCP.  Also sent mychart message. Last OV 01/26/20

## 2021-05-15 ENCOUNTER — Ambulatory Visit: Payer: Medicare Other | Admitting: Internal Medicine

## 2021-05-23 ENCOUNTER — Other Ambulatory Visit: Payer: Self-pay | Admitting: Internal Medicine

## 2021-05-23 NOTE — Telephone Encounter (Signed)
Please refill as per office routine med refill policy (all routine meds refilled for 3 mo or monthly per pt preference up to one year from last visit, then month to month grace period for 3 mo, then further med refills will have to be denied)  

## 2021-05-25 ENCOUNTER — Encounter: Payer: Self-pay | Admitting: Internal Medicine

## 2021-05-25 ENCOUNTER — Ambulatory Visit (INDEPENDENT_AMBULATORY_CARE_PROVIDER_SITE_OTHER): Payer: Medicare Other | Admitting: Internal Medicine

## 2021-05-25 ENCOUNTER — Other Ambulatory Visit: Payer: Self-pay

## 2021-05-25 VITALS — BP 126/86 | HR 73 | Temp 98.0°F | Ht 72.0 in | Wt 225.0 lb

## 2021-05-25 DIAGNOSIS — E7800 Pure hypercholesterolemia, unspecified: Secondary | ICD-10-CM | POA: Diagnosis not present

## 2021-05-25 DIAGNOSIS — E611 Iron deficiency: Secondary | ICD-10-CM

## 2021-05-25 DIAGNOSIS — E559 Vitamin D deficiency, unspecified: Secondary | ICD-10-CM | POA: Diagnosis not present

## 2021-05-25 DIAGNOSIS — E538 Deficiency of other specified B group vitamins: Secondary | ICD-10-CM | POA: Diagnosis not present

## 2021-05-25 DIAGNOSIS — Z86010 Personal history of colonic polyps: Secondary | ICD-10-CM | POA: Diagnosis not present

## 2021-05-25 DIAGNOSIS — E039 Hypothyroidism, unspecified: Secondary | ICD-10-CM | POA: Diagnosis not present

## 2021-05-25 DIAGNOSIS — R739 Hyperglycemia, unspecified: Secondary | ICD-10-CM | POA: Diagnosis not present

## 2021-05-25 DIAGNOSIS — N320 Bladder-neck obstruction: Secondary | ICD-10-CM

## 2021-05-25 LAB — BASIC METABOLIC PANEL
BUN: 21 mg/dL (ref 6–23)
CO2: 28 mEq/L (ref 19–32)
Calcium: 9.4 mg/dL (ref 8.4–10.5)
Chloride: 105 mEq/L (ref 96–112)
Creatinine, Ser: 0.86 mg/dL (ref 0.40–1.50)
GFR: 86.19 mL/min (ref >60.00)
Glucose, Bld: 77 mg/dL (ref 70–99)
Potassium: 4.2 mEq/L (ref 3.5–5.1)
Sodium: 141 mEq/L (ref 135–145)

## 2021-05-25 LAB — URINALYSIS, ROUTINE W REFLEX MICROSCOPIC
Bilirubin Urine: NEGATIVE
Hgb urine dipstick: NEGATIVE
Ketones, ur: NEGATIVE
Leukocytes,Ua: NEGATIVE
Nitrite: NEGATIVE
RBC / HPF: NONE SEEN (ref 0–?)
Specific Gravity, Urine: 1.025 (ref 1.000–1.030)
Total Protein, Urine: NEGATIVE
Urine Glucose: NEGATIVE
Urobilinogen, UA: 0.2 (ref 0.0–1.0)
pH: 6 (ref 5.0–8.0)

## 2021-05-25 LAB — CBC WITH DIFFERENTIAL/PLATELET
Basophils Absolute: 0 10*3/uL (ref 0.0–0.1)
Basophils Relative: 0.7 % (ref 0.0–3.0)
Eosinophils Absolute: 0.3 10*3/uL (ref 0.0–0.7)
Eosinophils Relative: 4.6 % (ref 0.0–5.0)
HCT: 45.6 % (ref 39.0–52.0)
Hemoglobin: 15.4 g/dL (ref 13.0–17.0)
Lymphocytes Relative: 19.7 % (ref 12.0–46.0)
Lymphs Abs: 1.3 10*3/uL (ref 0.7–4.0)
MCHC: 33.8 g/dL (ref 30.0–36.0)
MCV: 92.3 fl (ref 78.0–100.0)
Monocytes Absolute: 0.7 10*3/uL (ref 0.1–1.0)
Monocytes Relative: 11 % (ref 3.0–12.0)
Neutro Abs: 4.2 10*3/uL (ref 1.4–7.7)
Neutrophils Relative %: 64 % (ref 43.0–77.0)
Platelets: 156 10*3/uL (ref 150.0–400.0)
RBC: 4.94 Mil/uL (ref 4.22–5.81)
RDW: 13.2 % (ref 11.5–15.5)
WBC: 6.5 10*3/uL (ref 4.0–10.5)

## 2021-05-25 LAB — LIPID PANEL
Cholesterol: 159 mg/dL (ref 0–200)
HDL: 47 mg/dL (ref >39.00)
LDL Cholesterol: 87 mg/dL (ref 0–99)
NonHDL: 111.77
Total CHOL/HDL Ratio: 3
Triglycerides: 123 mg/dL (ref 0.0–149.0)
VLDL: 24.6 mg/dL (ref 0.0–40.0)

## 2021-05-25 LAB — TSH: TSH: 4.45 u[IU]/mL (ref 0.35–5.50)

## 2021-05-25 LAB — HEMOGLOBIN A1C: Hgb A1c MFr Bld: 6.1 % (ref 4.6–6.5)

## 2021-05-25 LAB — HEPATIC FUNCTION PANEL
ALT: 23 U/L (ref 0–53)
AST: 25 U/L (ref 0–37)
Albumin: 4.3 g/dL (ref 3.5–5.2)
Alkaline Phosphatase: 57 U/L (ref 39–117)
Bilirubin, Direct: 0.1 mg/dL (ref 0.0–0.3)
Total Bilirubin: 0.4 mg/dL (ref 0.2–1.2)
Total Protein: 6.8 g/dL (ref 6.0–8.3)

## 2021-05-25 LAB — VITAMIN B12: Vitamin B-12: 586 pg/mL (ref 211–911)

## 2021-05-25 LAB — VITAMIN D 25 HYDROXY (VIT D DEFICIENCY, FRACTURES): VITD: 33.96 ng/mL (ref 30.00–100.00)

## 2021-05-25 LAB — PSA: PSA: 0.66 ng/mL (ref 0.10–4.00)

## 2021-05-25 MED ORDER — ROSUVASTATIN CALCIUM 40 MG PO TABS: 40.0000 mg | ORAL_TABLET | Freq: Every day | ORAL | 3 refills | Status: DC

## 2021-05-25 MED ORDER — LEVOTHYROXINE SODIUM 75 MCG PO TABS: 75.0000 ug | ORAL_TABLET | Freq: Every day | ORAL | 3 refills | Status: DC

## 2021-05-25 NOTE — Progress Notes (Signed)
Patient ID: Kevin Stein, male   DOB: 07/23/1946, 75 y.o.   MRN: [id number]         Chief Complaint:: yearly exam       HPI:  Kevin Stein is a 75 y.o. male here overall doing ok, Pt denies chest pain, increased sob or doe, wheezing, orthopnea, PND, increased LE swelling, palpitations, dizziness or syncope.   Pt denies polydipsia, polyuria, or new focal neuro s/s.   Pt denies fever, wt loss, night sweats, loss of appetite, or other constitutional symptoms  Trying to follow lower chol diet.  No other new complaints.  Due for colonoscoy   Wt Readings from Last 3 Encounters:  05/25/21 225 lb (102.1 kg)  01/26/20 223 lb 12.8 oz (101.5 kg)  08/28/18 229 lb (103.9 kg)   BP Readings from Last 3 Encounters:  05/25/21 126/86  01/26/20 (!) 146/82  08/28/18 114/72   Immunization History  Administered Date(s) Administered   Influenza, High Dose Seasonal PF 08/15/2015, 07/26/2018   Influenza,inj,Quad PF,6+ Mos 07/15/2014, 08/26/2016   Influenza-Unspecified 08/09/2017, 07/26/2018, 07/22/2019   Moderna Sars-Covid-2 Vaccination 12/03/2019, 12/31/2019, 07/24/2020   Pneumococcal Conjugate-13 08/01/2014   Pneumococcal Polysaccharide-23 06/11/2013   Td 10/02/2009   Tdap 07/23/2019   Zoster Recombinat (Shingrix) 10/03/2018, 12/22/2018   Zoster, Live 07/06/2008   Health Maintenance Due  Topic Date Due   COLONOSCOPY (Pts 45-37yr Insurance coverage will need to be confirmed)  10/18/2020      Past Medical History:  Diagnosis Date   ALLERGIC RHINITIS 07/06/2008   Qualifier: Diagnosis of  By: JJenny ReichmannMD, JHunt Oris   BENIGN PROSTATIC HYPERTROPHY 07/06/2008   Qualifier: Diagnosis of  By: JJenny ReichmannMD, [place] HX OF 07/06/2008   Qualifier: Diagnosis of  By: JJenny ReichmannMD, [place], RIGHT SHOULDER 07/06/2008   Qualifier: Diagnosis of  By: JJenny ReichmannMD, [place] COLON 07/06/2008   Qualifier: Diagnosis of  By: JJenny ReichmannMD, JHunt Oris   HYPERLIPIDEMIA  07/06/2008   Qualifier: Diagnosis of  By: JJenny ReichmannMD, [place]9/16/2009   Qualifier: Diagnosis of  By: JJenny ReichmannMD, JHunt Oris   Peripheral vascular disease ([facility]    Varices of other sites 12/21/2008   Qualifier: Diagnosis of  By: JJenny ReichmannMD, JHunt Oris   Past Surgical History:  Procedure Laterality Date   AXILLARY LYMPH NODE DISSECTION     left     1978    COLONOSCOPY     left middle ear surgury     right shoulder surgury  2010   Dr NVeverly Fells  TRANSFORAMINAL LUMBAR INTERBODY FUSION (TLIF) WITH PEDICLE SCREW FIXATION 1 LEVEL     VARICOSE VEIN SURGERY     VASECTOMY      reports that he has never smoked. He has never used smokeless tobacco. He reports current alcohol use. He reports that he does not use drugs. family history includes Cancer in his father; Diabetes in his mother; Hypertension in his mother. No Known Allergies No current outpatient medications on file prior to visit.   No current facility-administered medications on file prior to visit.        ROS:  All others reviewed and negative.  Objective        PE:  BP 126/86 (BP Location: Left Arm, Patient Position: Sitting, Cuff Size: Large)   Pulse 73   Temp 98 F (36.7 C) (Oral)  Ht 6' (1.829 m)   Wt 225 lb (102.1 kg)   SpO2 98%   BMI 30.52 kg/m                 Constitutional: Pt appears in NAD               HENT: Head: NCAT.                Right Ear: External ear normal.                 Left Ear: External ear normal.                Eyes: . Pupils are equal, round, and reactive to light. Conjunctivae and EOM are normal               Nose: without d/c or deformity               Neck: Neck supple. Gross normal ROM               Cardiovascular: Normal rate and regular rhythm.                 Pulmonary/Chest: Effort normal and breath sounds without rales or wheezing.                Abd:  Soft, NT, ND, + BS, no organomegaly               Neurological: Pt is alert. At baseline orientation, motor grossly intact                Skin: Skin is warm. No rashes, no other new lesions, LE edema - none               Psychiatric: Pt behavior is normal without agitation   Micro: none  Cardiac tracings I have personally interpreted today:  none  Pertinent Radiological findings (summarize): none   Lab Results  Component Value Date   WBC 6.5 05/25/2021   HGB 15.4 05/25/2021   HCT 45.6 05/25/2021   PLT 156.0 05/25/2021   GLUCOSE 77 05/25/2021   CHOL 159 05/25/2021   TRIG 123.0 05/25/2021   HDL 47.00 05/25/2021   LDLDIRECT 155.6 09/25/2009   LDLCALC 87 05/25/2021   ALT 23 05/25/2021   AST 25 05/25/2021   NA 141 05/25/2021   K 4.2 05/25/2021   CL 105 05/25/2021   CREATININE 0.86 05/25/2021   BUN 21 05/25/2021   CO2 28 05/25/2021   TSH 4.45 05/25/2021   PSA 0.66 05/25/2021   INR 1.0 10/26/2008   HGBA1C 6.1 05/25/2021   Assessment/Plan:  Kevin Stein is a 75 y.o. White or Caucasian [1] male with  has a past medical history of ALLERGIC RHINITIS (07/06/2008), BENIGN PROSTATIC HYPERTROPHY (07/06/2008), COLONIC POLYPS, HX OF (07/06/2008), DEGENERATIVE JOINT DISEASE, RIGHT SHOULDER (07/06/2008), DIVERTICULOSIS, COLON (07/06/2008), HYPERLIPIDEMIA (07/06/2008), HYPOTHYROIDISM (07/06/2008), Peripheral vascular disease ([place]), and Varices of other sites (12/21/2008).  Hypothyroidism Lab Results  Component Value Date   TSH 4.45 05/25/2021   Stable, pt to continue levothyroxine   Hyperlipidemia Lab Results  Component Value Date   LDLCALC 87 05/25/2021   Uncontrolled, goal ldl < 70 pt for change pravstatin to crestor for better LDL reduction, and lower chol diet   Hyperglycemia Lab Results  Component Value Date   HGBA1C 6.1 05/25/2021   Stable, pt to continue current medical treatment  - diet and wt control  Followup: Return in about 1 year (  around 05/25/2022).  Cathlean Cower, MD 05/26/2021 2:20 PM [facility]

## 2021-05-25 NOTE — Patient Instructions (Addendum)
You will be contacted regarding the referral for: colonoscopy  Ok to change the pravastatin to crestor 40 mg per day  Please continue all other medications as before, and refills have been done if requested.  Please have the pharmacy call with any other refills you may need.  Please continue your efforts at being more active, low cholesterol diet, and weight control.  You are otherwise up to date with prevention measures today.  Please keep your appointments with your specialists as you may have planned  Please go to the LAB at the blood drawing area for the tests to be done  You will be contacted by phone if any changes need to be made immediately.  Otherwise, you will receive a letter about your results with an explanation, but please check with MyChart first.  Please remember to sign up for MyChart if you have not done so, as this will be important to you in the future with finding out test results, communicating by private email, and scheduling acute appointments online when needed.  Please make an Appointment to return for your 1 year visit, or sooner if needed

## 2021-05-26 ENCOUNTER — Encounter: Payer: Self-pay | Admitting: Internal Medicine

## 2021-05-26 NOTE — Assessment & Plan Note (Signed)
Lab Results  Component Value Date   HGBA1C 6.1 05/25/2021   Stable, pt to continue current medical treatment  - diet and wt control

## 2021-05-26 NOTE — Assessment & Plan Note (Signed)
Lab Results  Component Value Date   TSH 4.45 05/25/2021   Stable, pt to continue levothyroxine

## 2021-05-26 NOTE — Assessment & Plan Note (Signed)
Lab Results  Component Value Date   LDLCALC 87 05/25/2021   Uncontrolled, goal ldl < 70 pt for change pravstatin to crestor for better LDL reduction, and lower chol diet

## 2021-05-27 ENCOUNTER — Encounter: Payer: Self-pay | Admitting: Internal Medicine

## 2021-07-31 ENCOUNTER — Encounter: Payer: Self-pay | Admitting: Internal Medicine

## 2021-08-07 DIAGNOSIS — Z23 Encounter for immunization: Secondary | ICD-10-CM | POA: Diagnosis not present

## 2021-09-26 ENCOUNTER — Ambulatory Visit (AMBULATORY_SURGERY_CENTER): Payer: Self-pay

## 2021-09-26 ENCOUNTER — Other Ambulatory Visit: Payer: Self-pay

## 2021-09-26 VITALS — Ht 72.0 in | Wt 224.0 lb

## 2021-09-26 DIAGNOSIS — Z86010 Personal history of colonic polyps: Secondary | ICD-10-CM

## 2021-09-26 MED ORDER — SUTAB 1479-225-188 MG PO TABS: 1.0000 | ORAL_TABLET | ORAL | 0 refills | Status: DC

## 2021-09-26 NOTE — Progress Notes (Signed)
Denies allergies to eggs or soy products. Denies complication of anesthesia or sedation. Denies use of weight loss medication. Denies use of O2.   Emmi instructions given for colonoscopy.  

## 2021-10-10 ENCOUNTER — Other Ambulatory Visit: Payer: Self-pay

## 2021-10-10 ENCOUNTER — Encounter: Payer: Self-pay | Admitting: Internal Medicine

## 2021-10-10 ENCOUNTER — Ambulatory Visit (AMBULATORY_SURGERY_CENTER): Payer: Medicare Other | Admitting: Internal Medicine

## 2021-10-10 VITALS — BP 132/78 | HR 60 | Temp 97.9°F | Resp 21 | Ht 72.0 in | Wt 220.0 lb

## 2021-10-10 DIAGNOSIS — Z86010 Personal history of colonic polyps: Secondary | ICD-10-CM

## 2021-10-10 DIAGNOSIS — D120 Benign neoplasm of cecum: Secondary | ICD-10-CM

## 2021-10-10 MED ORDER — SODIUM CHLORIDE 0.9 % IV SOLN: 500.0000 mL | Freq: Once | INTRAVENOUS | Status: DC

## 2021-10-10 NOTE — Progress Notes (Signed)
Sedate, gd SR, tolerated procedure well, VSS, report to RN 

## 2021-10-10 NOTE — Op Note (Signed)
[facility] Patient Name: Kevin Stein Procedure Date: 10/10/2021 1:33 PM MRN: [id number] Endoscopist: Docia Chuck. Henrene Pastor , MD Age: 75 Referring MD:  Date of Birth: 1946/05/15 Gender: Male Account #: [id number] Procedure:                Colonoscopy with cold snare polypectomy x 1 Indications:              High risk colon cancer surveillance: Personal                            history of multiple (3 or more) adenomas. Previous                            examinations 2003, 2006, 2011, 2016 Medicines:                Monitored Anesthesia Care Procedure:                Pre-Anesthesia Assessment:                           - Prior to the procedure, a History and Physical                            was performed, and patient medications and                            allergies were reviewed. The patient's tolerance of                            previous anesthesia was also reviewed. The risks                            and benefits of the procedure and the sedation                            options and risks were discussed with the patient.                            All questions were answered, and informed consent                            was obtained. Prior Anticoagulants: The patient has                            taken no previous anticoagulant or antiplatelet                            agents. ASA Grade Assessment: II - A patient with                            mild systemic disease. After reviewing the risks                            and benefits, the patient was deemed in  satisfactory condition to undergo the procedure.                           After obtaining informed consent, the colonoscope                            was passed under direct vision. Throughout the                            procedure, the patient's blood pressure, pulse, and                            oxygen saturations were monitored continuously. The                             CF [id number] #[id number] was introduced through the anus                            and advanced to the the cecum, identified by                            appendiceal orifice and ileocecal valve. The                            ileocecal valve, appendiceal orifice, and rectum                            were photographed. The quality of the bowel                            preparation was excellent. The colonoscopy was                            performed without difficulty. The patient tolerated                            the procedure well. The bowel preparation used was                            SUPREP via split dose instruction. Scope In: 1:43:06 PM Scope Out: 2:07:06 PM Scope Withdrawal Time: 0 hours 14 minutes 4 seconds  Total Procedure Duration: 0 hours 24 minutes 0 seconds  Findings:                 A 1 mm polyp was found in the cecum. The polyp was                            removed with a cold snare. Resection and retrieval                            were complete.                           Multiple diverticula were found in the left colon.  An occasional right-sided diverticula. Most                            significant in the sigmoid colon.                           The exam was otherwise without abnormality on                            direct and retroflexion views. Internal hemorrhoids                            present. Complications:            No immediate complications. Estimated blood loss:                            None. Estimated Blood Loss:     Estimated blood loss: none. Impression:               - One 1 mm polyp in the cecum, removed with a cold                            snare. Resected and retrieved.                           - Diverticulosis in the left and right colon.                            Internal hemorrhoids                           - The examination was otherwise normal on direct                            and  retroflexion views. Recommendation:           - Repeat colonoscopy is not recommended for                            surveillance.                           - Patient has a contact number available for                            emergencies. The signs and symptoms of potential                            delayed complications were discussed with the                            patient. Return to normal activities tomorrow.                            Written discharge instructions were provided to the  patient.                           - Resume previous diet.                           - Continue present medications.                           - Await pathology results. Docia Chuck. Henrene Pastor, MD 10/10/2021 2:18:14 PM This report has been signed electronically.

## 2021-10-10 NOTE — Patient Instructions (Signed)

## 2021-10-10 NOTE — Progress Notes (Signed)
Called to room to assist during endoscopic procedure.  Patient ID and intended procedure confirmed with present staff. Received instructions for my participation in the procedure from the performing physician.  

## 2021-10-10 NOTE — Progress Notes (Signed)
HISTORY OF PRESENT ILLNESS:  Kevin Stein is a 75 y.o. male who presents today for surveillance colonoscopy.  He has a history of multiple adenomatous polyps.  Last examination 2016.  REVIEW OF SYSTEMS:  All non-GI ROS negative. Past Medical History:  Diagnosis Date   ALLERGIC RHINITIS 07/06/2008   Qualifier: Diagnosis of  By: Jenny Reichmann MD, Hunt Oris    Allergy    BENIGN PROSTATIC HYPERTROPHY 07/06/2008   Qualifier: Diagnosis of  By: Jenny Reichmann MD, Hunt Oris    Blood transfusion without reported diagnosis    COLONIC POLYPS, HX OF 07/06/2008   Qualifier: Diagnosis of  By: Jenny Reichmann MD, [place] DISEASE, RIGHT SHOULDER 07/06/2008   Qualifier: Diagnosis of  By: Jenny Reichmann MD, [place], COLON 07/06/2008   Qualifier: Diagnosis of  By: Jenny Reichmann MD, Hunt Oris    HYPERLIPIDEMIA 07/06/2008   Qualifier: Diagnosis of  By: Jenny Reichmann MD, Hunt Oris    HYPOTHYROIDISM 07/06/2008   Qualifier: Diagnosis of  By: Jenny Reichmann MD, Hunt Oris    Peripheral vascular disease [facility])    Varices of other sites 12/21/2008   Qualifier: Diagnosis of  By: Jenny Reichmann MD, Hunt Oris     Past Surgical History:  Procedure Laterality Date   AXILLARY LYMPH NODE DISSECTION     left     1978    COLONOSCOPY     left middle ear surgury     right shoulder surgury  2010   Dr Veverly Fells   TRANSFORAMINAL LUMBAR INTERBODY FUSION (TLIF) WITH PEDICLE SCREW FIXATION 1 LEVEL     VARICOSE VEIN SURGERY     VASECTOMY      Social History Kevin Stein  reports that he has never smoked. He has never used smokeless tobacco. He reports current alcohol use. He reports that he does not use drugs.  family history includes Cancer in his father; Diabetes in his mother; Hypertension in his mother.  No Known Allergies     PHYSICAL EXAMINATION:  Vital signs: BP (!) 153/73    Pulse 69    Temp 97.9 F (36.6 C)    Ht 6' (1.829 m)    Wt 220 lb (99.8 kg)    SpO2 98%    BMI 29.84 kg/m  General: Well-developed, well-nourished, no acute  distress HEENT: Sclerae are anicteric, conjunctiva pink. Oral mucosa intact Lungs: Clear Heart: Regular Abdomen: soft, nontender, nondistended, no obvious ascites, no peritoneal signs, normal bowel sounds. No organomegaly. Extremities: No edema Psychiatric: alert and oriented x3. Cooperative     ASSESSMENT:  1.  History of multiple adenomatous colon polyps   PLAN:   1.  Surveillance colonoscopy

## 2021-10-10 NOTE — Progress Notes (Signed)
VS by CW  Pt's states no medical or surgical changes since previsit or office visit.  

## 2021-10-12 ENCOUNTER — Telehealth: Payer: Self-pay

## 2021-10-12 NOTE — Telephone Encounter (Signed)
Follow up telephone call placed. VM obtained and message left that staff will try to call back again later today. SChaplin, RN,BSN

## 2021-10-23 ENCOUNTER — Encounter: Payer: Self-pay | Admitting: Internal Medicine

## 2022-02-26 DIAGNOSIS — Z23 Encounter for immunization: Secondary | ICD-10-CM | POA: Diagnosis not present

## 2022-03-28 DIAGNOSIS — H2513 Age-related nuclear cataract, bilateral: Secondary | ICD-10-CM | POA: Diagnosis not present

## 2022-06-12 ENCOUNTER — Other Ambulatory Visit: Payer: Self-pay | Admitting: Internal Medicine

## 2022-06-12 NOTE — Telephone Encounter (Signed)
Please refill as per office routine med refill policy (all routine meds to be refilled for 3 mo or monthly (per pt preference) up to one year from last visit, then month to month grace period for 3 mo, then further med refills will have to be denied) ? ?

## 2022-07-24 ENCOUNTER — Other Ambulatory Visit: Payer: Self-pay | Admitting: Internal Medicine

## 2022-07-24 NOTE — Telephone Encounter (Signed)
Please refill as per office routine med refill policy (all routine meds to be refilled for 3 mo or monthly (per pt preference) up to one year from last visit, then month to month grace period for 3 mo, then further med refills will have to be denied) ? ?

## 2022-08-15 ENCOUNTER — Telehealth: Payer: Self-pay | Admitting: Internal Medicine

## 2022-08-15 ENCOUNTER — Other Ambulatory Visit: Payer: Self-pay | Admitting: Internal Medicine

## 2022-08-15 NOTE — Telephone Encounter (Signed)
N/A unable to leave a message for patient to call back to schedule Medicare Annual Wellness Visit   Last AWV  08/27/17  Please schedule at anytime with [facility] if patient calls the office back.     Any questions, please call me at [phone]

## 2022-08-15 NOTE — Telephone Encounter (Signed)
Please refill as per office routine med refill policy (all routine meds to be refilled for 3 mo or monthly (per pt preference) up to one year from last visit, then month to month grace period for 3 mo, then further med refills will have to be denied) ? ?

## 2022-08-31 ENCOUNTER — Other Ambulatory Visit: Payer: Self-pay | Admitting: Internal Medicine

## 2022-08-31 NOTE — Telephone Encounter (Signed)
Please refill as per office routine med refill policy (all routine meds to be refilled for 3 mo or monthly (per pt preference) up to one year from last visit, then month to month grace period for 3 mo, then further med refills will have to be denied) ? ?

## 2022-09-13 DIAGNOSIS — Z23 Encounter for immunization: Secondary | ICD-10-CM | POA: Diagnosis not present

## 2022-09-16 ENCOUNTER — Telehealth: Payer: Self-pay | Admitting: Internal Medicine

## 2022-09-16 NOTE — Telephone Encounter (Signed)
Sorry, it appears from the chart that his insurance is traditional medicare, which will not pay for lab tests prior to visit

## 2022-09-16 NOTE — Telephone Encounter (Signed)
Wife, [place] called to ask if PCP can order labs so pt can come a few days early before the annual physical to get labs done. Please advise via MyChart message per their request.

## 2022-09-16 NOTE — Telephone Encounter (Signed)
Patient informed about labs in voice message.

## 2022-09-16 NOTE — Telephone Encounter (Signed)
Request for labs before upcoming office visit.

## 2022-09-25 ENCOUNTER — Other Ambulatory Visit: Payer: Self-pay | Admitting: Internal Medicine

## 2022-09-25 NOTE — Telephone Encounter (Signed)
Please refill as per office routine med refill policy (all routine meds to be refilled for 3 mo or monthly (per pt preference) up to one year from last visit, then month to month grace period for 3 mo, then further med refills will have to be denied) ? ?

## 2022-10-03 ENCOUNTER — Other Ambulatory Visit: Payer: Self-pay | Admitting: Internal Medicine

## 2022-10-03 ENCOUNTER — Ambulatory Visit (INDEPENDENT_AMBULATORY_CARE_PROVIDER_SITE_OTHER): Payer: Medicare Other | Admitting: Internal Medicine

## 2022-10-03 ENCOUNTER — Encounter: Payer: Self-pay | Admitting: Internal Medicine

## 2022-10-03 VITALS — BP 146/80 | HR 62 | Temp 98.3°F | Ht 72.0 in | Wt 224.0 lb

## 2022-10-03 DIAGNOSIS — N320 Bladder-neck obstruction: Secondary | ICD-10-CM

## 2022-10-03 DIAGNOSIS — R350 Frequency of micturition: Secondary | ICD-10-CM

## 2022-10-03 DIAGNOSIS — Z125 Encounter for screening for malignant neoplasm of prostate: Secondary | ICD-10-CM

## 2022-10-03 DIAGNOSIS — L989 Disorder of the skin and subcutaneous tissue, unspecified: Secondary | ICD-10-CM | POA: Diagnosis not present

## 2022-10-03 DIAGNOSIS — E039 Hypothyroidism, unspecified: Secondary | ICD-10-CM | POA: Diagnosis not present

## 2022-10-03 DIAGNOSIS — E7800 Pure hypercholesterolemia, unspecified: Secondary | ICD-10-CM

## 2022-10-03 DIAGNOSIS — R9431 Abnormal electrocardiogram [ECG] [EKG]: Secondary | ICD-10-CM

## 2022-10-03 DIAGNOSIS — E559 Vitamin D deficiency, unspecified: Secondary | ICD-10-CM

## 2022-10-03 DIAGNOSIS — R739 Hyperglycemia, unspecified: Secondary | ICD-10-CM | POA: Diagnosis not present

## 2022-10-03 DIAGNOSIS — E538 Deficiency of other specified B group vitamins: Secondary | ICD-10-CM

## 2022-10-03 LAB — VITAMIN B12: Vitamin B-12: 532 pg/mL (ref 211–911)

## 2022-10-03 LAB — BASIC METABOLIC PANEL
BUN: 19 mg/dL (ref 6–23)
CO2: 29 mEq/L (ref 19–32)
Calcium: 9.4 mg/dL (ref 8.4–10.5)
Chloride: 106 mEq/L (ref 96–112)
Creatinine, Ser: 0.81 mg/dL (ref 0.40–1.50)
GFR: 86.93 mL/min (ref >60.00)
Glucose, Bld: 96 mg/dL (ref 70–99)
Potassium: 4.3 mEq/L (ref 3.5–5.1)
Sodium: 141 mEq/L (ref 135–145)

## 2022-10-03 LAB — HEPATIC FUNCTION PANEL
ALT: 25 U/L (ref 0–53)
AST: 23 U/L (ref 0–37)
Albumin: 4.4 g/dL (ref 3.5–5.2)
Alkaline Phosphatase: 49 U/L (ref 39–117)
Bilirubin, Direct: 0.1 mg/dL (ref 0.0–0.3)
Total Bilirubin: 0.6 mg/dL (ref 0.2–1.2)
Total Protein: 6.7 g/dL (ref 6.0–8.3)

## 2022-10-03 LAB — LIPID PANEL
Cholesterol: 118 mg/dL (ref 0–200)
HDL: 45.1 mg/dL (ref >39.00)
LDL Cholesterol: 58 mg/dL (ref 0–99)
NonHDL: 73.3
Total CHOL/HDL Ratio: 3
Triglycerides: 79 mg/dL (ref 0.0–149.0)
VLDL: 15.8 mg/dL (ref 0.0–40.0)

## 2022-10-03 LAB — CBC WITH DIFFERENTIAL/PLATELET
Basophils Absolute: 0 10*3/uL (ref 0.0–0.1)
Basophils Relative: 0.5 % (ref 0.0–3.0)
Eosinophils Absolute: 0.1 10*3/uL (ref 0.0–0.7)
Eosinophils Relative: 1.9 % (ref 0.0–5.0)
HCT: 45 % (ref 39.0–52.0)
Hemoglobin: 15.3 g/dL (ref 13.0–17.0)
Lymphocytes Relative: 21.5 % (ref 12.0–46.0)
Lymphs Abs: 1.1 10*3/uL (ref 0.7–4.0)
MCHC: 34 g/dL (ref 30.0–36.0)
MCV: 91.2 fl (ref 78.0–100.0)
Monocytes Absolute: 0.5 10*3/uL (ref 0.1–1.0)
Monocytes Relative: 10 % (ref 3.0–12.0)
Neutro Abs: 3.5 10*3/uL (ref 1.4–7.7)
Neutrophils Relative %: 66.1 % (ref 43.0–77.0)
Platelets: 182 10*3/uL (ref 150.0–400.0)
RBC: 4.94 Mil/uL (ref 4.22–5.81)
RDW: 13.2 % (ref 11.5–15.5)
WBC: 5.3 10*3/uL (ref 4.0–10.5)

## 2022-10-03 LAB — URINALYSIS, ROUTINE W REFLEX MICROSCOPIC
Bilirubin Urine: NEGATIVE
Hgb urine dipstick: NEGATIVE
Ketones, ur: NEGATIVE
Leukocytes,Ua: NEGATIVE
Nitrite: NEGATIVE
Specific Gravity, Urine: >=1.03 — AB (ref 1.000–1.030)
Total Protein, Urine: NEGATIVE
Urine Glucose: NEGATIVE
Urobilinogen, UA: 0.2 (ref 0.0–1.0)
pH: 5.5 (ref 5.0–8.0)

## 2022-10-03 LAB — TSH: TSH: 3.58 u[IU]/mL (ref 0.35–5.50)

## 2022-10-03 LAB — HEMOGLOBIN A1C: Hgb A1c MFr Bld: 6.3 % (ref 4.6–6.5)

## 2022-10-03 LAB — VITAMIN D 25 HYDROXY (VIT D DEFICIENCY, FRACTURES): VITD: 33.64 ng/mL (ref 30.00–100.00)

## 2022-10-03 LAB — PSA: PSA: 0.56 ng/mL (ref 0.10–4.00)

## 2022-10-03 MED ORDER — SOLIFENACIN SUCCINATE 5 MG PO TABS: 5.0000 mg | ORAL_TABLET | Freq: Every day | ORAL | 3 refills | Status: DC

## 2022-10-03 NOTE — Assessment & Plan Note (Signed)
Lab Results  Component Value Date   HGBA1C 6.1 05/25/2021   Stable, pt to continue current medical treatment  - diet, wt control, excercise

## 2022-10-03 NOTE — Patient Instructions (Addendum)
Please take all new medication as prescribed - the toviaz '4mg'$  per day for the bladder  You will be contacted regarding the referral for: cardiac CT score  Please continue all other medications as before, and refills have been done if requested.  Please have the pharmacy call with any other refills you may need.  Please continue your efforts at being more active, low cholesterol diet, and weight control.  You are otherwise up to date with prevention measures today.  Please keep your appointments with your specialists as you may have planned  Please go to the LAB at the blood drawing area for the tests to be done  You will be contacted by phone if any changes need to be made immediately.  Otherwise, you will receive a letter about your results with an explanation, but please check with MyChart first.  Please remember to sign up for MyChart if you have not done so, as this will be important to you in the future with finding out test results, communicating by private email, and scheduling acute appointments online when needed.  Please make an Appointment to return in 6 months, or sooner if needed

## 2022-10-03 NOTE — Assessment & Plan Note (Signed)
Lab Results  Component Value Date   TSH 4.45 05/25/2021   Stable, pt to continue levothyroxine 75 mcg qd

## 2022-10-03 NOTE — Progress Notes (Signed)
Patient ID: Kevin Stein, male   DOB: 06/16/1946, 76 y.o.   MRN: [id number]        Chief Complaint: follow up right chin lesion, urinary frequency, htn, hyperglycemia       HPI:  Kevin Stein is a 76 y.o. male here overall doing ok, but has worsening nocturia and urinary frequency but Denies urinary symptoms such as dysuria, urgency, flank pain, hematuria or n/v, fever, chills.  Has a non healing lesion to right chin that bleeds every time he shaves,  Willing for cardiac ct score   BP has been controlled home.  Pt denies chest pain, increased sob or doe, wheezing, orthopnea, PND, increased LE swelling, palpitations, dizziness or syncope.   Pt denies polydipsia, polyuria, or new focal neuro s/s.   Wt Readings from Last 3 Encounters:  10/03/22 224 lb (101.6 kg)  10/10/21 220 lb (99.8 kg)  09/26/21 224 lb (101.6 kg)   BP Readings from Last 3 Encounters:  10/03/22 (!) 146/80  10/10/21 132/78  05/25/21 126/86         Past Medical History:  Diagnosis Date   ALLERGIC RHINITIS 07/06/2008   Qualifier: Diagnosis of  By: Jenny Reichmann MD, Hunt Oris    Allergy    BENIGN PROSTATIC HYPERTROPHY 07/06/2008   Qualifier: Diagnosis of  By: Jenny Reichmann MD, Hunt Oris    Blood transfusion without reported diagnosis    COLONIC POLYPS, HX OF 07/06/2008   Qualifier: Diagnosis of  By: Jenny Reichmann MD, [place] DISEASE, RIGHT SHOULDER 07/06/2008   Qualifier: Diagnosis of  By: Jenny Reichmann MD, [place], COLON 07/06/2008   Qualifier: Diagnosis of  By: Jenny Reichmann MD, Hunt Oris    HYPERLIPIDEMIA 07/06/2008   Qualifier: Diagnosis of  By: Jenny Reichmann MD, Hunt Oris    HYPOTHYROIDISM 07/06/2008   Qualifier: Diagnosis of  By: Jenny Reichmann MD, Hunt Oris    Peripheral vascular disease [facility])    Varices of other sites 12/21/2008   Qualifier: Diagnosis of  By: Jenny Reichmann MD, Hunt Oris    Past Surgical History:  Procedure Laterality Date   AXILLARY LYMPH NODE DISSECTION     left     1978    COLONOSCOPY     left middle ear surgury      right shoulder surgury  2010   Dr Veverly Fells   TRANSFORAMINAL LUMBAR INTERBODY FUSION (TLIF) WITH PEDICLE SCREW FIXATION 1 LEVEL     VARICOSE VEIN SURGERY     VASECTOMY      reports that he has never smoked. He has never used smokeless tobacco. He reports current alcohol use. He reports that he does not use drugs. family history includes Cancer in his father; Diabetes in his mother; Hypertension in his mother. No Known Allergies Current Outpatient Medications on File Prior to Visit  Medication Sig Dispense Refill   levothyroxine (SYNTHROID) 75 MCG tablet TAKE 1 TABLET BY MOUTH EVERY DAY 90 tablet 3   rosuvastatin (CRESTOR) 40 MG tablet TAKE 1 TABLET BY MOUTH EVERY DAY 30 tablet 0   No current facility-administered medications on file prior to visit.        ROS:  All others reviewed and negative.  Objective        PE:  BP (!) 146/80 (BP Location: Left Arm, Patient Position: Sitting, Cuff Size: Large)   Pulse 62   Temp 98.3 F (36.8 C) (Oral)   Ht 6' (1.829 m)   Wt 224 lb (101.6 kg)  SpO2 98%   BMI 30.38 kg/m                 Constitutional: Pt appears in NAD               HENT: Head: NCAT.                Right Ear: External ear normal.                 Left Ear: External ear normal.                Eyes: . Pupils are equal, round, and reactive to light. Conjunctivae and EOM are normal               Nose: without d/c or deformity               Neck: Neck supple. Gross normal ROM               Cardiovascular: Normal rate and regular rhythm.                 Pulmonary/Chest: Effort normal and breath sounds without rales or wheezing.                Abd:  Soft, NT, ND, + BS, no organomegaly               Neurological: Pt is alert. At baseline orientation, motor grossly intact               Skin: Skin is warm. Right chin with non healing scabbed lesion, LE edema - none               Psychiatric: Pt behavior is normal without agitation   Micro: none  Cardiac tracings I have  personally interpreted today:  none  Pertinent Radiological findings (summarize): none   Lab Results  Component Value Date   WBC 6.5 05/25/2021   HGB 15.4 05/25/2021   HCT 45.6 05/25/2021   PLT 156.0 05/25/2021   GLUCOSE 77 05/25/2021   CHOL 159 05/25/2021   TRIG 123.0 05/25/2021   HDL 47.00 05/25/2021   LDLDIRECT 155.6 09/25/2009   LDLCALC 87 05/25/2021   ALT 23 05/25/2021   AST 25 05/25/2021   NA 141 05/25/2021   K 4.2 05/25/2021   CL 105 05/25/2021   CREATININE 0.86 05/25/2021   BUN 21 05/25/2021   CO2 28 05/25/2021   TSH 4.45 05/25/2021   PSA 0.66 05/25/2021   INR 1.0 10/26/2008   HGBA1C 6.1 05/25/2021   Assessment/Plan:  Kevin Stein is a 76 y.o. White or Caucasian [1] male with  has a past medical history of ALLERGIC RHINITIS (07/06/2008), Allergy, BENIGN PROSTATIC HYPERTROPHY (07/06/2008), Blood transfusion without reported diagnosis, COLONIC POLYPS, HX OF (07/06/2008), DEGENERATIVE JOINT DISEASE, RIGHT SHOULDER (07/06/2008), DIVERTICULOSIS, COLON (07/06/2008), HYPERLIPIDEMIA (07/06/2008), HYPOTHYROIDISM (07/06/2008), Peripheral vascular disease ([place]), and Varices of other sites (12/21/2008).  Hyperglycemia Lab Results  Component Value Date   HGBA1C 6.1 05/25/2021   Stable, pt to continue current medical treatment  - diet, wt control, excercise   Hyperlipidemia Lab Results  Component Value Date   LDLCALC 87 05/25/2021   Stable, pt to continue current statin crestor 40 mg qd   Hypothyroidism Lab Results  Component Value Date   TSH 4.45 05/25/2021   Stable, pt to continue levothyroxine 75 mcg qd   Skin lesion of face Right chin non healing, can't r/o skin ca but declines derm referral for  now  Urinary frequency Not improved with flomax, ok for trial toviaz 4 mg qd  Followup: Return in about 6 months (around 04/04/2023).  Cathlean Cower, MD 10/03/2022 4:49 PM [facility]

## 2022-10-03 NOTE — Assessment & Plan Note (Signed)
Right chin non healing, can't r/o skin ca but declines derm referral for now

## 2022-10-03 NOTE — Assessment & Plan Note (Signed)
Not improved with flomax, ok for trial toviaz 4 mg qd

## 2022-10-03 NOTE — Assessment & Plan Note (Signed)
Lab Results  Component Value Date   LDLCALC 87 05/25/2021   Stable, pt to continue current statin crestor 40 mg qd

## 2022-10-10 ENCOUNTER — Telehealth: Payer: Self-pay | Admitting: Internal Medicine

## 2022-10-10 MED ORDER — FESOTERODINE FUMARATE ER 4 MG PO TB24: 4.0000 mg | ORAL_TABLET | Freq: Every day | ORAL | 3 refills | Status: DC

## 2022-10-10 NOTE — Telephone Encounter (Signed)
Ok for try change to Lisbeth Ply - I will send to [facility] local

## 2022-10-10 NOTE — Telephone Encounter (Signed)
Patient called and said that the Vesicare is going to be $553.00 He wants to know if Dr. Jenny Reichmann can send in something that will be less expensive.  [facility] to [facility] on [address].  Patient's #  [phone]

## 2022-10-10 NOTE — Telephone Encounter (Signed)
Patient unable to afford vesicare and is requesting something less expensive if possible, please advise

## 2022-10-10 NOTE — Telephone Encounter (Signed)
See below

## 2022-10-15 NOTE — Telephone Encounter (Signed)
Left message regarding medication change

## 2022-10-17 ENCOUNTER — Ambulatory Visit (HOSPITAL_COMMUNITY)
Admission: RE | Admit: 2022-10-17 | Discharge: 2022-10-17 | Disposition: A | Discharge status: Home / Self Care | Payer: Medicare Other | Source: Ambulatory Visit | Attending: Internal Medicine | Admitting: Internal Medicine

## 2022-10-17 DIAGNOSIS — R9431 Abnormal electrocardiogram [ECG] [EKG]: Secondary | ICD-10-CM | POA: Insufficient documentation

## 2022-10-17 DIAGNOSIS — R739 Hyperglycemia, unspecified: Secondary | ICD-10-CM | POA: Insufficient documentation

## 2022-10-17 DIAGNOSIS — E7800 Pure hypercholesterolemia, unspecified: Secondary | ICD-10-CM | POA: Insufficient documentation

## 2022-10-17 MED ORDER — OXYBUTYNIN CHLORIDE ER 5 MG PO TB24: 5.0000 mg | ORAL_TABLET | Freq: Every day | ORAL | 3 refills | Status: DC

## 2022-10-17 NOTE — Telephone Encounter (Signed)
Left message regarding med change sent to pharmacy

## 2022-10-17 NOTE — Telephone Encounter (Signed)
Spoke with the pt and his pharmacy. The pt states the $%80 co pay for the Lisbeth Ply is way to much to afford. Pharmacy stated pt has to pay the $580 oop price in order to get meds. This price is at the 90 day supply rate. Pt wants an alternative sent in.

## 2022-10-17 NOTE — Addendum Note (Signed)
Addended by: Biagio Borg on: 10/17/2022 12:04 PM   Modules accepted: Orders

## 2022-10-17 NOTE — Telephone Encounter (Signed)
Ok to try oxybutinin ER 5 mg per day - done erx

## 2022-10-25 ENCOUNTER — Other Ambulatory Visit: Payer: Self-pay | Admitting: Internal Medicine

## 2022-10-25 NOTE — Telephone Encounter (Signed)
Please refill as per office routine med refill policy (all routine meds to be refilled for 3 mo or monthly (per pt preference) up to one year from last visit, then month to month grace period for 3 mo, then further med refills will have to be denied) ? ?

## 2023-03-06 DIAGNOSIS — Z23 Encounter for immunization: Secondary | ICD-10-CM | POA: Diagnosis not present

## 2023-03-10 ENCOUNTER — Ambulatory Visit: Payer: Medicare Other | Admitting: Internal Medicine

## 2023-03-10 ENCOUNTER — Ambulatory Visit (INDEPENDENT_AMBULATORY_CARE_PROVIDER_SITE_OTHER): Payer: Medicare Other

## 2023-03-10 ENCOUNTER — Telehealth: Payer: Self-pay

## 2023-03-10 VITALS — BP 130/72 | HR 80 | Ht 72.0 in | Wt 227.0 lb

## 2023-03-10 DIAGNOSIS — Z0000 Encounter for general adult medical examination without abnormal findings: Secondary | ICD-10-CM

## 2023-03-10 NOTE — Telephone Encounter (Signed)
Pt was called to schedule 6 month f/u but was wondering if it would be possible to change it to a year instead? Last visit was December 2023. He reports if he has any issues he will contact us and make an appointment but at this time he has been doing fine. I advised I will have to check with Dr. Jonny Ruiz in regards to this.

## 2023-03-10 NOTE — Telephone Encounter (Signed)
Called pt and scheduled f/u in December

## 2023-03-10 NOTE — Telephone Encounter (Signed)
Ok for 1 year f/u visit instead of 6 mo  - thanks

## 2023-03-10 NOTE — Progress Notes (Signed)
Subjective:   Kevin Stein is a 77 y.o. male who presents for Medicare Annual/Subsequent preventive examination.  Review of Systems    No ROS. Medicare Wellness Visit. Additional risk factors are reflected in social history. Cardiac Risk Factors include: advanced age (>28men, >55 women);dyslipidemia;male gender;obesity (BMI >30kg/m2)     Objective:    Today's Vitals   03/10/23 1034 03/10/23 1046  BP: (!) 142/86 130/72  Pulse: 80   SpO2: 98%   Weight: 227 lb (103 kg)   Height: 6' (1.829 m)    Body mass index is 30.79 kg/m.     03/10/2023   10:53 AM 10/05/2015    2:28 PM 06/16/2013    6:43 AM 06/10/2013    9:03 AM 02/15/2013    3:11 AM  Advanced Directives  Does Patient Have a Medical Advance Directive? Yes Yes Patient has advance directive, copy not in chart Patient has advance directive, copy not in chart Patient has advance directive, copy not in chart  Type of Advance Directive Living will;Healthcare Power of [employer] Power of [place];Living will  Healthcare Power of [place];Living will Healthcare Power of Attorney  Does patient want to make changes to medical advance directive? No - Patient declined      Copy of Healthcare Power of Attorney in Chart? Yes - validated most recent copy scanned in chart (See row information)   Copy requested from family Copy requested from family  Would patient like information on creating a medical advance directive?  No - patient declined information     Pre-existing out of facility DNR order (yellow form or pink MOST form)     No    Current Medications (verified) Outpatient Encounter Medications as of 03/10/2023  Medication Sig   levothyroxine (SYNTHROID) 75 MCG tablet TAKE 1 TABLET BY MOUTH EVERY DAY   rosuvastatin (CRESTOR) 40 MG tablet TAKE 1 TABLET BY MOUTH EVERY DAY   oxybutynin (DITROPAN-XL) 5 MG 24 hr tablet Take 1 tablet (5 mg total) by mouth at bedtime. (Patient not taking: Reported on 03/10/2023)   No  facility-administered encounter medications on file as of 03/10/2023.    Allergies (verified) Patient has no known allergies.   History: Past Medical History:  Diagnosis Date   ALLERGIC RHINITIS 07/06/2008   Qualifier: Diagnosis of  By: Jonny Ruiz MD, Len Blalock    Allergy    BENIGN PROSTATIC HYPERTROPHY 07/06/2008   Qualifier: Diagnosis of  By: Jonny Ruiz MD, Len Blalock    Blood transfusion without reported diagnosis    COLONIC POLYPS, HX OF 07/06/2008   Qualifier: Diagnosis of  By: Jonny Ruiz MD, Len Blalock    DEGENERATIVE JOINT DISEASE, RIGHT SHOULDER 07/06/2008   Qualifier: Diagnosis of  By: Jonny Ruiz MD, Len Blalock    DIVERTICULOSIS, COLON 07/06/2008   Qualifier: Diagnosis of  By: Jonny Ruiz MD, Len Blalock    HYPERLIPIDEMIA 07/06/2008   Qualifier: Diagnosis of  By: Jonny Ruiz MD, Len Blalock    HYPOTHYROIDISM 07/06/2008   Qualifier: Diagnosis of  By: Jonny Ruiz MD, Len Blalock    Peripheral vascular disease [facility])    Varices of other sites 12/21/2008   Qualifier: Diagnosis of  By: Jonny Ruiz MD, Len Blalock    Past Surgical History:  Procedure Laterality Date   AXILLARY LYMPH NODE DISSECTION     left     1978    COLONOSCOPY     left middle ear surgury     right shoulder surgury  2010   Dr Ranell Patrick   TRANSFORAMINAL LUMBAR INTERBODY FUSION (  TLIF) WITH PEDICLE SCREW FIXATION 1 LEVEL     VARICOSE VEIN SURGERY     VASECTOMY     Family History  Problem Relation Age of Onset   Diabetes Mother    Hypertension Mother    Cancer Father    Colon cancer Neg Hx    Esophageal cancer Neg Hx    Rectal cancer Neg Hx    Stomach cancer Neg Hx    Social History   Socioeconomic History   Marital status: Married    Spouse name: Not on file   Number of children: Not on file   Years of education: Not on file   Highest education level: Associate degree: occupational, Scientist, product/process development, or vocational program  Occupational History   Not on file  Tobacco Use   Smoking status: Never   Smokeless tobacco: Never  Substance and Sexual Activity   Alcohol use: Yes     Comment: Seldom   Drug use: No   Sexual activity: Yes    Birth control/protection: None  Other Topics Concern   Not on file  Social History Narrative   Not on file   Social Determinants of Health   Financial Resource Strain: Low Risk  (03/10/2023)   Overall Financial Resource Strain (CARDIA)    Difficulty of Paying Living Expenses: Not hard at all  Food Insecurity: No Food Insecurity (03/10/2023)   Hunger Vital Sign    Worried About Running Out of Food in the Last Year: Never true    Ran Out of Food in the Last Year: Never true  Transportation Needs: No Transportation Needs (03/10/2023)   PRAPARE - Administrator, Civil Service (Medical): No    Lack of Transportation (Non-Medical): No  Physical Activity: Sufficiently Active (03/10/2023)   Exercise Vital Sign    Days of Exercise per Week: 7 days    Minutes of Exercise per Session: 60 min  Stress: No Stress Concern Present (03/10/2023)   [employer] of Occupational Health - Occupational Stress Questionnaire    Feeling of Stress : Only a little  Social Connections: Moderately Isolated (03/10/2023)   Social Connection and Isolation Panel [NHANES]    Frequency of Communication with Friends and Family: Twice a week    Frequency of Social Gatherings with Friends and Family: Once a week    Attends Religious Services: Never    Database administrator or Organizations: No    Attends Engineer, structural: Never    Marital Status: Married    Tobacco Counseling Counseling given: Not Answered   Clinical Intake:  Pre-visit preparation completed: Yes  Pain : No/denies pain     BMI - recorded: 30 Nutritional Status: BMI > 30  Obese Nutritional Risks: None Diabetes: No  How often do you need to have someone help you when you read instructions, pamphlets, or other written materials from your doctor or pharmacy?: 1 - Never What is the last grade level you completed in school?: 3 years of college  Interpreter  Needed?: No  Information entered by :: Elyse Jarvis, CMA   Activities of Daily Living    03/10/2023   10:54 AM  In your present state of health, do you have any difficulty performing the following activities:  Hearing? 0  Comment 60%-65% hearing in left ear  Vision? 0  Difficulty concentrating or making decisions? 0  Walking or climbing stairs? 0  Dressing or bathing? 0  Doing errands, shopping? 0  Preparing Food and eating ? N  Using the Toilet? N  In the past six months, have you accidently leaked urine? N  Do you have problems with loss of bowel control? N  Managing your Medications? N  Managing your Finances? N  Housekeeping or managing your Housekeeping? N    Patient Care Team: Corwin Levins, MD as PCP - General  Indicate any recent Medical Services you may have received from other than [facility] providers in the past year (date may be approximate).     Assessment:   This is a routine wellness examination for Kevin Stein.  Hearing/Vision screen No hearing aids. 60%-65% hearing in left ear. Patient does wear corrective lenses.  Dietary issues and exercise activities discussed: Current Exercise Habits: Home exercise routine, Type of exercise: walking, Time (Minutes): 60, Frequency (Times/Week): 7, Weekly Exercise (Minutes/Week): 420, Intensity: Mild, Exercise limited by: None identified   Goals Addressed             This Visit's Progress    Patient Stated       I would like to lose a little bit of weight.       Depression Screen    03/10/2023   10:52 AM 10/03/2022    2:00 PM 05/25/2021    1:26 PM 05/25/2021    1:03 PM 01/26/2020   11:36 AM 08/28/2018   11:07 AM 08/27/2017    9:00 AM  PHQ 2/9 Scores  PHQ - 2 Score 0 0 0 0 0 0 0  PHQ- 9 Score  0     0    Fall Risk    03/10/2023   10:54 AM 10/03/2022    2:00 PM 05/25/2021    1:26 PM 05/25/2021    1:03 PM 01/26/2020   11:36 AM  Fall Risk   Falls in the past year? 0 0 0 0 0  Number falls in past yr: 0  0 0    Injury with Fall? 0 0 0 0   Risk for fall due to : No Fall Risks No Fall Risks     Follow up Falls evaluation completed Falls evaluation completed       FALL RISK PREVENTION PERTAINING TO THE HOME:  Any stairs in or around the home? Yes only 2 in living room If so, are there any without handrails? No  Home free of loose throw rugs in walkways, pet beds, electrical cords, etc? Yes  Adequate lighting in your home to reduce risk of falls? Yes   ASSISTIVE DEVICES UTILIZED TO PREVENT FALLS:  Life alert? No  Use of a cane, walker or w/c? No  Grab bars in the bathroom? Yes  Shower chair or bench in shower? Yes  Elevated toilet seat or a handicapped toilet? No   TIMED UP AND GO:  Was the test performed? No .  Length of time to ambulate 10 feet: N/A sec.   Patient stated that he has no issues with gait or balance; does not use any assistive devices.  Cognitive Function:  Patient is cogitatively intact.      03/10/2023   10:55 AM  6CIT Screen  What Year? 0 points  What month? 0 points  What time? 0 points  Count back from 20 0 points  Months in reverse 0 points  Repeat phrase 0 points  Total Score 0 points    Immunizations Immunization History  Administered Date(s) Administered   Influenza, High Dose Seasonal PF 08/15/2015, 07/26/2018   Influenza,inj,Quad PF,6+ Mos 07/15/2014, 08/26/2016   Influenza-Unspecified 08/09/2017, 07/26/2018, 07/22/2019,  08/06/2022   Moderna Sars-Covid-2 Vaccination 12/03/2019, 12/31/2019, 07/24/2020   PFIZER(Purple Top)SARS-COV-2 Vaccination 08/05/2022   Pfizer Covid-19 Vaccine Bivalent Booster 77yrs & up 02/26/2022   Pneumococcal Conjugate-13 08/01/2014   Pneumococcal Polysaccharide-23 06/11/2013   Respiratory Syncytial Virus Vaccine,Recomb Aduvanted(Arexvy) 08/12/2022   Td 10/02/2009   Tdap 07/23/2019   Zoster Recombinat (Shingrix) 10/03/2018, 12/22/2018   Zoster, Live 07/06/2008    TDAP status: Up to date  Flu Vaccine status: Up to  date  Pneumococcal vaccine status: Up to date  Covid-19 vaccine status: Completed vaccines  Qualifies for Shingles Vaccine? Yes   Zostavax completed No   Shingrix Completed?: Yes  Screening Tests Health Maintenance  Topic Date Due   COVID-19 Vaccine (6 - 2023-24 season) 03/26/2023 (Originally 09/30/2022)   INFLUENZA VACCINE  05/22/2023   Medicare Annual Wellness (AWV)  03/09/2024   DTaP/Tdap/Td (3 - Td or Tdap) 07/22/2029   Pneumonia Vaccine 1+ Years old  Completed   Hepatitis C Screening  Completed   Zoster Vaccines- Shingrix  Completed   HPV VACCINES  Aged Out   COLONOSCOPY (Pts 45-22yrs Insurance coverage will need to be confirmed)  Discontinued    Health Maintenance  There are no preventive care reminders to display for this patient.   Colorectal cancer screening: Type of screening: Colonoscopy. Completed 10/10/2021. Repeat every N/A years  Lung Cancer Screening: (Low Dose CT Chest recommended if Age 57-80 years, 30 pack-year currently smoking OR have quit w/in 15years.) does not qualify.   Lung Cancer Screening Referral: N/A  Additional Screening:  Hepatitis C Screening: does qualify; Completed 08/15/2015  Vision Screening: Recommended annual ophthalmology exams for early detection of glaucoma and other disorders of the eye. Is the patient up to date with their annual eye exam?  Yes  Who is the provider or what is the name of the office in which the patient attends annual eye exams? Dr. Nile Riggs If pt is not established with a provider, would they like to be referred to a provider to establish care? No .   Dental Screening: Recommended annual dental exams for proper oral hygiene  Community Resource Referral / Chronic Care Management: CRR required this visit?  No   CCM required this visit?  No      Plan:     I have personally reviewed and noted the following in the patient's chart:   Medical and social history Use of alcohol, tobacco or illicit drugs   Current medications and supplements including opioid prescriptions. Patient is not currently taking opioid prescriptions. Functional ability and status Nutritional status Physical activity Advanced directives List of other physicians Hospitalizations, surgeries, and ER visits in previous 12 months Vitals Screenings to include cognitive, depression, and falls Referrals and appointments  In addition, I have reviewed and discussed with patient certain preventive protocols, quality metrics, and best practice recommendations. A written personalized care plan for preventive services as well as general preventive health recommendations were provided to patient.     Marinus Maw, CMA   03/10/2023   Nurse Notes: N/A

## 2023-03-10 NOTE — Patient Instructions (Signed)
It was great speaking with you today!  Please schedule your next Medicare Wellness Visit with your Nurse Health Advisor in 1 year by calling [phone]. 

## 2023-03-31 DIAGNOSIS — H52203 Unspecified astigmatism, bilateral: Secondary | ICD-10-CM | POA: Diagnosis not present

## 2023-03-31 DIAGNOSIS — H25813 Combined forms of age-related cataract, bilateral: Secondary | ICD-10-CM | POA: Diagnosis not present

## 2023-03-31 DIAGNOSIS — H524 Presbyopia: Secondary | ICD-10-CM | POA: Diagnosis not present

## 2023-03-31 DIAGNOSIS — H5203 Hypermetropia, bilateral: Secondary | ICD-10-CM | POA: Diagnosis not present

## 2023-05-09 ENCOUNTER — Other Ambulatory Visit: Payer: Self-pay | Admitting: Internal Medicine

## 2023-05-09 ENCOUNTER — Other Ambulatory Visit: Payer: Self-pay

## 2023-08-07 DIAGNOSIS — Z23 Encounter for immunization: Secondary | ICD-10-CM | POA: Diagnosis not present

## 2023-09-23 ENCOUNTER — Ambulatory Visit: Payer: Medicare Other | Admitting: Internal Medicine

## 2023-10-01 ENCOUNTER — Encounter: Payer: Self-pay | Admitting: Internal Medicine

## 2023-10-01 ENCOUNTER — Ambulatory Visit (INDEPENDENT_AMBULATORY_CARE_PROVIDER_SITE_OTHER): Payer: Medicare Other | Admitting: Internal Medicine

## 2023-10-01 VITALS — BP 130/72 | HR 75 | Temp 97.9°F | Ht 72.0 in | Wt 223.0 lb

## 2023-10-01 DIAGNOSIS — R739 Hyperglycemia, unspecified: Secondary | ICD-10-CM

## 2023-10-01 DIAGNOSIS — E7800 Pure hypercholesterolemia, unspecified: Secondary | ICD-10-CM | POA: Diagnosis not present

## 2023-10-01 DIAGNOSIS — N481 Balanitis: Secondary | ICD-10-CM | POA: Diagnosis not present

## 2023-10-01 DIAGNOSIS — N320 Bladder-neck obstruction: Secondary | ICD-10-CM | POA: Diagnosis not present

## 2023-10-01 DIAGNOSIS — E039 Hypothyroidism, unspecified: Secondary | ICD-10-CM | POA: Diagnosis not present

## 2023-10-01 DIAGNOSIS — E559 Vitamin D deficiency, unspecified: Secondary | ICD-10-CM | POA: Diagnosis not present

## 2023-10-01 LAB — URINALYSIS, ROUTINE W REFLEX MICROSCOPIC
Bilirubin Urine: NEGATIVE
Hgb urine dipstick: NEGATIVE
Ketones, ur: NEGATIVE
Leukocytes,Ua: NEGATIVE
Nitrite: NEGATIVE
RBC / HPF: NONE SEEN (ref 0–?)
Specific Gravity, Urine: 1.02 (ref 1.000–1.030)
Total Protein, Urine: NEGATIVE
Urine Glucose: NEGATIVE
Urobilinogen, UA: 0.2 (ref 0.0–1.0)
pH: 7.5 (ref 5.0–8.0)

## 2023-10-01 LAB — CBC WITH DIFFERENTIAL/PLATELET
Basophils Absolute: 0 10*3/uL (ref 0.0–0.1)
Basophils Relative: 0.4 % (ref 0.0–3.0)
Eosinophils Absolute: 0.2 10*3/uL (ref 0.0–0.7)
Eosinophils Relative: 3.8 % (ref 0.0–5.0)
HCT: 46.5 % (ref 39.0–52.0)
Hemoglobin: 15.2 g/dL (ref 13.0–17.0)
Lymphocytes Relative: 21.2 % (ref 12.0–46.0)
Lymphs Abs: 1.1 10*3/uL (ref 0.7–4.0)
MCHC: 32.7 g/dL (ref 30.0–36.0)
MCV: 93.9 fL (ref 78.0–100.0)
Monocytes Absolute: 0.7 10*3/uL (ref 0.1–1.0)
Monocytes Relative: 12.8 % — ABNORMAL HIGH (ref 3.0–12.0)
Neutro Abs: 3.2 10*3/uL (ref 1.4–7.7)
Neutrophils Relative %: 61.8 % (ref 43.0–77.0)
Platelets: 165 10*3/uL (ref 150.0–400.0)
RBC: 4.95 Mil/uL (ref 4.22–5.81)
RDW: 13.4 % (ref 11.5–15.5)
WBC: 5.1 10*3/uL (ref 4.0–10.5)

## 2023-10-01 LAB — BASIC METABOLIC PANEL
BUN: 19 mg/dL (ref 6–23)
CO2: 30 meq/L (ref 19–32)
Calcium: 9.4 mg/dL (ref 8.4–10.5)
Chloride: 106 meq/L (ref 96–112)
Creatinine, Ser: 0.79 mg/dL (ref 0.40–1.50)
GFR: 86.98 mL/min (ref >60.00)
Glucose, Bld: 102 mg/dL — ABNORMAL HIGH (ref 70–99)
Potassium: 4.7 meq/L (ref 3.5–5.1)
Sodium: 142 meq/L (ref 135–145)

## 2023-10-01 LAB — LIPID PANEL
Cholesterol: 119 mg/dL (ref 0–200)
HDL: 45.5 mg/dL (ref >39.00)
LDL Cholesterol: 52 mg/dL (ref 0–99)
NonHDL: 73.75
Total CHOL/HDL Ratio: 3
Triglycerides: 110 mg/dL (ref 0.0–149.0)
VLDL: 22 mg/dL (ref 0.0–40.0)

## 2023-10-01 LAB — HEPATIC FUNCTION PANEL
ALT: 21 U/L (ref 0–53)
AST: 22 U/L (ref 0–37)
Albumin: 4.3 g/dL (ref 3.5–5.2)
Alkaline Phosphatase: 52 U/L (ref 39–117)
Bilirubin, Direct: 0.1 mg/dL (ref 0.0–0.3)
Total Bilirubin: 0.5 mg/dL (ref 0.2–1.2)
Total Protein: 6.4 g/dL (ref 6.0–8.3)

## 2023-10-01 LAB — HEMOGLOBIN A1C: Hgb A1c MFr Bld: 6.2 % (ref 4.6–6.5)

## 2023-10-01 LAB — TSH: TSH: 3.21 u[IU]/mL (ref 0.35–5.50)

## 2023-10-01 LAB — VITAMIN D 25 HYDROXY (VIT D DEFICIENCY, FRACTURES): VITD: 26.6 ng/mL — ABNORMAL LOW (ref 30.00–100.00)

## 2023-10-01 LAB — PSA: PSA: 0.56 ng/mL (ref 0.10–4.00)

## 2023-10-01 MED ORDER — LEVOTHYROXINE SODIUM 75 MCG PO TABS: 75.0000 ug | ORAL_TABLET | Freq: Every day | ORAL | 3 refills | Status: DC

## 2023-10-01 MED ORDER — ROSUVASTATIN CALCIUM 40 MG PO TABS: 40.0000 mg | ORAL_TABLET | Freq: Every day | ORAL | 3 refills | Status: DC

## 2023-10-01 NOTE — Progress Notes (Addendum)
Patient ID: Kevin Stein, male   DOB: 09/10/1946, 77 y.o.   MRN: [id number]        Chief Complaint: follow up HTN, HLD and hyperglycemia , low vit d, low thyroid, allergies, balanitis       HPI:  Kevin Stein is a 77 y.o. male here overall doing ok, but c/o recent worsening glan penis rash with irritation mild discomfort, sight d/c uncircumcised; has happened before and tx with antifungal cream, asking for repeat.  Pt denies chest pain, increased sob or doe, wheezing, orthopnea, PND, increased LE swelling, palpitations, dizziness or syncope.   Pt denies polydipsia, polyuria, or new focal neuro s/s.    Pt denies fever, wt loss, night sweats, loss of appetite, or other constitutional symptoms   Wife with PD and sarcoid so much increased stress.    Wt Readings from Last 3 Encounters:  10/01/23 223 lb (101.2 kg)  03/10/23 227 lb (103 kg)  10/03/22 224 lb (101.6 kg)   BP Readings from Last 3 Encounters:  10/01/23 130/72  03/10/23 130/72  10/03/22 (!) 146/80         Past Medical History:  Diagnosis Date   ALLERGIC RHINITIS 07/06/2008   Qualifier: Diagnosis of  By: Jonny Ruiz MD, Len Blalock    Allergy    BENIGN PROSTATIC HYPERTROPHY 07/06/2008   Qualifier: Diagnosis of  By: Jonny Ruiz MD, Len Blalock    Blood transfusion without reported diagnosis    COLONIC POLYPS, HX OF 07/06/2008   Qualifier: Diagnosis of  By: Jonny Ruiz MD, Len Blalock    DEGENERATIVE JOINT DISEASE, RIGHT SHOULDER 07/06/2008   Qualifier: Diagnosis of  By: Jonny Ruiz MD, Len Blalock    DIVERTICULOSIS, COLON 07/06/2008   Qualifier: Diagnosis of  By: Jonny Ruiz MD, Len Blalock    HYPERLIPIDEMIA 07/06/2008   Qualifier: Diagnosis of  By: Jonny Ruiz MD, Len Blalock    HYPOTHYROIDISM 07/06/2008   Qualifier: Diagnosis of  By: Jonny Ruiz MD, Len Blalock    Peripheral vascular disease [facility])    Varices of other sites 12/21/2008   Qualifier: Diagnosis of  By: Jonny Ruiz MD, Len Blalock    Past Surgical History:  Procedure Laterality Date   AXILLARY LYMPH NODE DISSECTION     left     1978     COLONOSCOPY     left middle ear surgury     right shoulder surgury  2010   Dr Ranell Patrick   TRANSFORAMINAL LUMBAR INTERBODY FUSION (TLIF) WITH PEDICLE SCREW FIXATION 1 LEVEL     VARICOSE VEIN SURGERY     VASECTOMY      reports that he has never smoked. He has never used smokeless tobacco. He reports current alcohol use. He reports that he does not use drugs. family history includes Cancer in his father; Diabetes in his mother; Hypertension in his mother. No Known Allergies No current outpatient medications on file prior to visit.   No current facility-administered medications on file prior to visit.        ROS:  All others reviewed and negative.  Objective        PE:  BP 130/72 (BP Location: Right Arm, Patient Position: Sitting, Cuff Size: Normal)   Pulse 75   Temp 97.9 F (36.6 C) (Oral)   Ht 6' (1.829 m)   Wt 223 lb (101.2 kg)   SpO2 99%   BMI 30.24 kg/m                 Constitutional: Pt appears in NAD  HENT: Head: NCAT.                Right Ear: External ear normal.                 Left Ear: External ear normal.                Eyes: . Pupils are equal, round, and reactive to light. Conjunctivae and EOM are normal               Nose: without d/c or deformity               Neck: Neck supple. Gross normal ROM               Cardiovascular: Normal rate and regular rhythm.                 Pulmonary/Chest: Effort normal and breath sounds without rales or wheezing.                Abd:  Soft, NT, ND, + BS, no organomegaly               Neurological: Pt is alert. At baseline orientation, motor grossly intact               Skin: Skin is warm. No rashes, no other new lesions, LE edema - none;  GU  - glan penis with erythema               Psychiatric: Pt behavior is normal without agitation   Micro: none  Cardiac tracings I have personally interpreted today:  none  Pertinent Radiological findings (summarize): none   Lab Results  Component Value Date   WBC 5.1  10/01/2023   HGB 15.2 10/01/2023   HCT 46.5 10/01/2023   PLT 165.0 10/01/2023   GLUCOSE 102 (H) 10/01/2023   CHOL 119 10/01/2023   TRIG 110.0 10/01/2023   HDL 45.50 10/01/2023   LDLDIRECT 155.6 09/25/2009   LDLCALC 52 10/01/2023   ALT 21 10/01/2023   AST 22 10/01/2023   NA 142 10/01/2023   K 4.7 10/01/2023   CL 106 10/01/2023   CREATININE 0.79 10/01/2023   BUN 19 10/01/2023   CO2 30 10/01/2023   TSH 3.21 10/01/2023   PSA 0.56 10/01/2023   INR 1.0 10/26/2008   HGBA1C 6.2 10/01/2023   Assessment/Plan:  Kevin Stein is a 77 y.o. White or Caucasian [1] male with  has a past medical history of ALLERGIC RHINITIS (07/06/2008), Allergy, BENIGN PROSTATIC HYPERTROPHY (07/06/2008), Blood transfusion without reported diagnosis, COLONIC POLYPS, HX OF (07/06/2008), DEGENERATIVE JOINT DISEASE, RIGHT SHOULDER (07/06/2008), DIVERTICULOSIS, COLON (07/06/2008), HYPERLIPIDEMIA (07/06/2008), HYPOTHYROIDISM (07/06/2008), Peripheral vascular disease (HCC), and Varices of other sites (12/21/2008).  Vitamin D deficiency Last vitamin D Lab Results  Component Value Date   VD25OH 33.64 10/03/2022   Low, to start oral replacement   Hypothyroidism Lab Results  Component Value Date   TSH 3.21 10/01/2023   Stable, pt to continue levothyroxine 75 mcg qd   Hyperlipidemia Lab Results  Component Value Date   LDLCALC 52 10/01/2023   Stable, pt to continue current statin crestor 40 mg   Hyperglycemia Lab Results  Component Value Date   HGBA1C 6.2 10/01/2023   Stable, pt to continue current medical treatment  - diet, wt control   Balanitis Mild to mod, for ketocon cr asd,  to f/u any worsening symptoms or concerns  Followup: Return in about 1 year (around 09/30/2024).  Fayrene Fearing  Jonny Ruiz, MD 10/04/2023 4:07 PM [facility]

## 2023-10-01 NOTE — Progress Notes (Signed)
The test results show that your current treatment is OK, as the tests are stable.  Please continue the same plan.  There is no other need for change of treatment or further evaluation based on these results, at this time.  thanks 

## 2023-10-01 NOTE — Patient Instructions (Signed)

## 2023-10-01 NOTE — Assessment & Plan Note (Signed)
Last vitamin D Lab Results  Component Value Date   VD25OH 33.64 10/03/2022   Low, to start oral replacement

## 2023-10-04 ENCOUNTER — Encounter: Payer: Self-pay | Admitting: Internal Medicine

## 2023-10-04 DIAGNOSIS — N481 Balanitis: Secondary | ICD-10-CM | POA: Insufficient documentation

## 2023-10-04 NOTE — Assessment & Plan Note (Signed)
Lab Results  Component Value Date   TSH 3.21 10/01/2023   Stable, pt to continue levothyroxine 75 mcg qd

## 2023-10-04 NOTE — Assessment & Plan Note (Addendum)
Lab Results  Component Value Date   LDLCALC 52 10/01/2023   Stable, pt to continue current statin crestor 40 mg

## 2023-10-04 NOTE — Assessment & Plan Note (Signed)
Mild to mod, for ketocon cr asd,  to f/u any worsening symptoms or concerns

## 2023-10-04 NOTE — Assessment & Plan Note (Signed)
Lab Results  Component Value Date   HGBA1C 6.2 10/01/2023   Stable, pt to continue current medical treatment  - diet, wt control

## 2024-03-10 ENCOUNTER — Ambulatory Visit

## 2024-03-11 ENCOUNTER — Ambulatory Visit (INDEPENDENT_AMBULATORY_CARE_PROVIDER_SITE_OTHER)

## 2024-03-11 VITALS — Ht 72.0 in | Wt 223.0 lb

## 2024-03-11 DIAGNOSIS — Z0000 Encounter for general adult medical examination without abnormal findings: Secondary | ICD-10-CM | POA: Diagnosis not present

## 2024-03-11 NOTE — Progress Notes (Signed)
 Subjective:   Kevin Stein is a 78 y.o. who presents for a Medicare Wellness preventive visit.  As a reminder, Annual Wellness Visits don't include a physical exam, and some assessments may be limited, especially if this visit is performed virtually. We may recommend an in-person follow-up visit with your provider if needed.  Visit Complete: Virtual I connected with  [name] on 03/11/24 by a audio enabled telemedicine application and verified that I am speaking with the correct person using two identifiers.  Patient Location: Home  Provider Location: Office/Clinic  I discussed the limitations of evaluation and management by telemedicine. The patient expressed understanding and agreed to proceed.  Vital Signs: Because this visit was a virtual/telehealth visit, some criteria may be missing or patient reported. Any vitals not documented were not able to be obtained and vitals that have been documented are patient reported.  VideoDeclined- This patient declined Librarian, academic. Therefore the visit was completed with audio only.  Persons Participating in Visit: Patient.  AWV Questionnaire: No: Patient Medicare AWV questionnaire was not completed prior to this visit.  Cardiac Risk Factors include: advanced age (>77men, >20 women);male gender;dyslipidemia     Objective:     Today's Vitals   03/11/24 1514  Weight: 223 lb (101.2 kg)  Height: 6' (1.829 m)   Body mass index is 30.24 kg/m.     03/11/2024    3:22 PM 03/10/2023   10:53 AM 10/05/2015    2:28 PM 08/15/2015    3:51 PM 06/16/2013    6:43 AM 06/10/2013    9:03 AM 02/15/2013    3:11 AM  Advanced Directives  Does Patient Have a Medical Advance Directive? Yes Yes Yes -- Patient has advance directive, copy not in chart Patient has advance directive, copy not in chart Patient has advance directive, copy not in chart  Type of Advance Directive Healthcare Power of [place];Living  will Living will;Healthcare Power of [employer] Power of [place];Living will   Healthcare Power of [place];Living will Healthcare Power of Attorney  Does patient want to make changes to medical advance directive? No - Patient declined No - Patient declined       Copy of Healthcare Power of Attorney in Chart? Yes - validated most recent copy scanned in chart (See row information) Yes - validated most recent copy scanned in chart (See row information)    Copy requested from family Copy requested from family  Would patient like information on creating a medical advance directive?   No - patient declined information      Pre-existing out of facility DNR order (yellow form or pink MOST form)       No    Current Medications (verified) Outpatient Encounter Medications as of 03/11/2024  Medication Sig   levothyroxine  (SYNTHROID ) 75 MCG tablet Take 1 tablet (75 mcg total) by mouth daily.   rosuvastatin  (CRESTOR ) 40 MG tablet Take 1 tablet (40 mg total) by mouth daily.   No facility-administered encounter medications on file as of 03/11/2024.    Allergies (verified) Patient has no known allergies.   History: Past Medical History:  Diagnosis Date   ALLERGIC RHINITIS 07/06/2008   Qualifier: Diagnosis of  By: Autry Legions MD, Alveda Aures    Allergy    BENIGN PROSTATIC HYPERTROPHY 07/06/2008   Qualifier: Diagnosis of  By: Autry Legions MD, Alveda Aures    Blood transfusion without reported diagnosis    COLONIC POLYPS, HX OF 07/06/2008   Qualifier: Diagnosis of  By:  Autry Legions MD, Alveda Aures    DEGENERATIVE JOINT DISEASE, RIGHT SHOULDER 07/06/2008   Qualifier: Diagnosis of  By: Autry Legions MD, Alveda Aures    DIVERTICULOSIS, COLON 07/06/2008   Qualifier: Diagnosis of  By: Autry Legions MD, Alveda Aures    HYPERLIPIDEMIA 07/06/2008   Qualifier: Diagnosis of  By: Autry Legions MD, Alveda Aures    HYPOTHYROIDISM 07/06/2008   Qualifier: Diagnosis of  By: Autry Legions MD, Alveda Aures    Peripheral vascular disease [facility])    Varices of other sites 12/21/2008   Qualifier:  Diagnosis of  By: Autry Legions MD, Alveda Aures    Past Surgical History:  Procedure Laterality Date   AXILLARY LYMPH NODE DISSECTION     left     1978    COLONOSCOPY     left middle ear surgury     right shoulder surgury  2010   Dr Brunilda Capra   TRANSFORAMINAL LUMBAR INTERBODY FUSION (TLIF) WITH PEDICLE SCREW FIXATION 1 LEVEL     VARICOSE VEIN SURGERY     VASECTOMY     Family History  Problem Relation Age of Onset   Diabetes Mother    Hypertension Mother    Cancer Father    Colon cancer Neg Hx    Esophageal cancer Neg Hx    Rectal cancer Neg Hx    Stomach cancer Neg Hx    Social History   Socioeconomic History   Marital status: Married    Spouse name: Not on file   Number of children: Not on file   Years of education: Not on file   Highest education level: Associate degree: occupational, Scientist, product/process development, or vocational program  Occupational History   Occupation: RETIRED  Tobacco Use   Smoking status: Never   Smokeless tobacco: Never  Vaping Use   Vaping status: Never Used  Substance and Sexual Activity   Alcohol use: Yes    Comment: Seldom   Drug use: No   Sexual activity: Yes    Birth control/protection: None  Other Topics Concern   Not on file  Social History Narrative   Lives at home with wife and 1 dog and 1 cat/2025   Social Drivers of Health   Financial Resource Strain: Low Risk  (03/11/2024)   Overall Financial Resource Strain (CARDIA)    Difficulty of Paying Living Expenses: Not hard at all  Food Insecurity: No Food Insecurity (03/11/2024)   Hunger Vital Sign    Worried About Running Out of Food in the Last Year: Never true    Ran Out of Food in the Last Year: Never true  Transportation Needs: No Transportation Needs (03/11/2024)   PRAPARE - Administrator, Civil Service (Medical): No    Lack of Transportation (Non-Medical): No  Physical Activity: Sufficiently Active (03/11/2024)   Exercise Vital Sign    Days of Exercise per Week: 5 days    Minutes of Exercise  per Session: 50 min  Stress: No Stress Concern Present (03/11/2024)   [employer] of Occupational Health - Occupational Stress Questionnaire    Feeling of Stress : Not at all  Social Connections: Moderately Isolated (03/11/2024)   Social Connection and Isolation Panel [NHANES]    Frequency of Communication with Friends and Family: More than three times a week    Frequency of Social Gatherings with Friends and Family: Never    Attends Religious Services: Never    Database administrator or Organizations: No    Attends Banker Meetings: Never    Marital  Status: Married    Tobacco Counseling Counseling given: Not Answered    Clinical Intake:  Pre-visit preparation completed: Yes  Pain : No/denies pain     BMI - recorded: 30.24 Nutritional Status: BMI > 30  Obese Nutritional Risks: None Diabetes: No  Lab Results  Component Value Date   HGBA1C 6.2 10/01/2023   HGBA1C 6.3 10/03/2022   HGBA1C 6.1 05/25/2021     How often do you need to have someone help you when you read instructions, pamphlets, or other written materials from your doctor or pharmacy?: 1 - Never  Interpreter Needed?: No  Information entered by :: Lemonte Al, RMA   Activities of Daily Living     03/11/2024    3:14 PM  In your present state of health, do you have any difficulty performing the following activities:  Hearing? 1  Comment Rt ear hearing loss-per pt  Vision? 0  Difficulty concentrating or making decisions? 0  Walking or climbing stairs? 0  Dressing or bathing? 0  Doing errands, shopping? 0  Preparing Food and eating ? N  Using the Toilet? N  In the past six months, have you accidently leaked urine? N  Do you have problems with loss of bowel control? N  Managing your Medications? N  Managing your Finances? N  Housekeeping or managing your Housekeeping? N    Patient Care Team: Roslyn Coombe, MD as PCP - General  Indicate any recent Medical Services you may have  received from other than [facility] providers in the past year (date may be approximate).     Assessment:    This is a routine wellness examination for Mikale.  Hearing/Vision screen Hearing Screening - Comments:: Rt ear hearing loss-per pt Vision Screening - Comments:: Wears eyeglasses/    Goals Addressed             This Visit's Progress    Patient Stated   On track    I would like to lose a little bit of weight.       Depression Screen     03/11/2024    3:26 PM 10/01/2023    9:35 AM 03/10/2023   10:52 AM 10/03/2022    2:00 PM 05/25/2021    1:26 PM 05/25/2021    1:03 PM 01/26/2020   11:36 AM  PHQ 2/9 Scores  PHQ - 2 Score 0 0 0 0 0 0 0  PHQ- 9 Score 1   0       Fall Risk     03/11/2024    3:22 PM 10/01/2023    9:35 AM 03/10/2023   10:54 AM 10/03/2022    2:00 PM 05/25/2021    1:26 PM  Fall Risk   Falls in the past year? 0 0 0 0 0  Number falls in past yr: 0 0 0  0  Injury with Fall? 0 0 0 0 0  Risk for fall due to :  No Fall Risks No Fall Risks No Fall Risks   Follow up Falls evaluation completed;Falls prevention discussed Falls evaluation completed Falls evaluation completed Falls evaluation completed     MEDICARE RISK AT HOME:  Medicare Risk at Home Any stairs in or around the home?: Yes (one level/ 3 steps outside in front) If so, are there any without handrails?: Yes Home free of loose throw rugs in walkways, pet beds, electrical cords, etc?: Yes Adequate lighting in your home to reduce risk of falls?: Yes Life alert?: No Use of a cane,  walker or w/c?: No Grab bars in the bathroom?: Yes Shower chair or bench in shower?: Yes Elevated toilet seat or a handicapped toilet?: Yes  TIMED UP AND GO:  Was the test performed?  No  Cognitive Function: 6CIT completed    08/15/2015    3:56 PM  MMSE - Mini Mental State Exam  Not completed: --        03/11/2024    3:24 PM 03/10/2023   10:55 AM  6CIT Screen  What Year? 0 points 0 points  What month? 0 points 0  points  What time? 0 points 0 points  Count back from 20 0 points 0 points  Months in reverse 0 points 0 points  Repeat phrase 0 points 0 points  Total Score 0 points 0 points    Immunizations Immunization History  Administered Date(s) Administered   Fluad Quad(high Dose 65+) 09/05/2023   Influenza, High Dose Seasonal PF 08/15/2015, 07/26/2018   Influenza,inj,Quad PF,6+ Mos 07/15/2014, 08/26/2016   Influenza-Unspecified 08/09/2017, 07/26/2018, 07/22/2019, 08/06/2022   Moderna Sars-Covid-2 Vaccination 12/03/2019, 12/31/2019, 07/24/2020   PFIZER(Purple Top)SARS-COV-2 Vaccination 08/05/2022   Pfizer Covid-19 Vaccine Bivalent Booster 89yrs & up 02/26/2022, 09/05/2023   Pneumococcal Conjugate-13 08/01/2014   Pneumococcal Polysaccharide-23 06/11/2013   Respiratory Syncytial Virus Vaccine,Recomb Aduvanted(Arexvy) 08/12/2022   Td 10/02/2009   Tdap 07/23/2019   Zoster Recombinant(Shingrix) 10/03/2018, 12/22/2018   Zoster, Live 07/06/2008    Screening Tests Health Maintenance  Topic Date Due   COVID-19 Vaccine (7 - 2024-25 season) 10/31/2023   INFLUENZA VACCINE  05/21/2024   Medicare Annual Wellness (AWV)  03/11/2025   DTaP/Tdap/Td (3 - Td or Tdap) 07/22/2029   Pneumonia Vaccine 19+ Years old  Completed   Hepatitis C Screening  Completed   Zoster Vaccines- Shingrix  Completed   HPV VACCINES  Aged Out   Meningococcal B Vaccine  Aged Out   Colonoscopy  Discontinued    Health Maintenance  Health Maintenance Due  Topic Date Due   COVID-19 Vaccine (7 - 2024-25 season) 10/31/2023   Health Maintenance Items Addressed: See Nurse Notes  Additional Screening:  Vision Screening: Recommended annual ophthalmology exams for early detection of glaucoma and other disorders of the eye.  Dental Screening: Recommended annual dental exams for proper oral hygiene  Community Resource Referral / Chronic Care Management: CRR required this visit?  No   CCM required this visit?  No   Plan:     I have personally reviewed and noted the following in the patient's chart:   Medical and social history Use of alcohol, tobacco or illicit drugs  Current medications and supplements including opioid prescriptions. Patient is not currently taking opioid prescriptions. Functional ability and status Nutritional status Physical activity Advanced directives List of other physicians Hospitalizations, surgeries, and ER visits in previous 12 months Vitals Screenings to include cognitive, depression, and falls Referrals and appointments  In addition, I have reviewed and discussed with patient certain preventive protocols, quality metrics, and best practice recommendations. A written personalized care plan for preventive services as well as general preventive health recommendations were provided to patient.   Permelia Bamba L Ina Scrivens, CMA   03/11/2024   After Visit Summary: (MyChart) Due to this being a telephonic visit, the after visit summary with patients personalized plan was offered to patient via MyChart   Notes: Nothing significant to report at this time.

## 2024-03-11 NOTE — Patient Instructions (Signed)
 Kevin Stein , Thank you for taking time out of your busy schedule to complete your Annual Wellness Visit with me. I enjoyed our conversation and look forward to speaking with you again next year. I, as well as your care team,  appreciate your ongoing commitment to your health goals. Please review the following plan we discussed and let me know if I can assist you in the future. Your Game plan/ To Do List   Follow up Visits: Next Medicare AWV with our clinical staff: 03/18/2025.   Have you seen your provider in the last 6 months (3 months if uncontrolled diabetes)? Yes Next Office Visit with your provider: 09/30/2024.  Clinician Recommendations:  Aim for 30 minutes of exercise or brisk walking, 6-8 glasses of water, and 5 servings of fruits and vegetables each day. Keep up the good work.       This is a list of the screening recommended for you and due dates:  Health Maintenance  Topic Date Due   COVID-19 Vaccine (7 - 2024-25 season) 10/31/2023   Flu Shot  05/21/2024   Medicare Annual Wellness Visit  03/11/2025   DTaP/Tdap/Td vaccine (3 - Td or Tdap) 07/22/2029   Pneumonia Vaccine  Completed   Hepatitis C Screening  Completed   Zoster (Shingles) Vaccine  Completed   HPV Vaccine  Aged Out   Meningitis B Vaccine  Aged Out   Colon Cancer Screening  Discontinued    Advanced directives: (In Chart) A copy of your advanced directives are scanned into your chart should your provider ever need it. Advance Care Planning is important because it:  [x]  Makes sure you receive the medical care that is consistent with your values, goals, and preferences  [x]  It provides guidance to your family and loved ones and reduces their decisional burden about whether or not they are making the right decisions based on your wishes.  Follow the link provided in your after visit summary or read over the paperwork we have mailed to you to help you started getting your Advance Directives in place. If you need  assistance in completing these, please reach out to us  so that we can help you!  See attachments for Preventive Care and Fall Prevention Tips.

## 2024-04-28 ENCOUNTER — Telehealth: Payer: Self-pay | Admitting: Internal Medicine

## 2024-04-28 NOTE — Telephone Encounter (Signed)
 Copied from CRM [id number]. Topic: General - Other >> Apr 28, 2024 11:25 AM Henretta I wrote: Reason for CRM: Patient is calling because he is getting [facility] with [facility] and would like to know if visits with Dr. Norleen can be covered under the [facility]. Patient would like a call to discuss.

## 2024-04-30 NOTE — Telephone Encounter (Signed)
 Called and informed patient of Dr.John's response. They expressed understanding and noted that when they reach out to the [place] they will try to get more information

## 2024-04-30 NOTE — Telephone Encounter (Signed)
 Coverage here would depend on the [place].  I would not be able to really say about this otherwise.  thanks

## 2024-09-30 ENCOUNTER — Ambulatory Visit: Payer: Medicare Other | Admitting: Internal Medicine

## 2024-09-30 ENCOUNTER — Ambulatory Visit: Payer: Self-pay | Admitting: Internal Medicine

## 2024-09-30 VITALS — BP 118/70 | HR 65 | Temp 98.3°F | Ht 72.0 in | Wt 213.0 lb

## 2024-09-30 DIAGNOSIS — R351 Nocturia: Secondary | ICD-10-CM | POA: Insufficient documentation

## 2024-09-30 DIAGNOSIS — E7800 Pure hypercholesterolemia, unspecified: Secondary | ICD-10-CM | POA: Diagnosis not present

## 2024-09-30 DIAGNOSIS — R739 Hyperglycemia, unspecified: Secondary | ICD-10-CM

## 2024-09-30 DIAGNOSIS — E559 Vitamin D deficiency, unspecified: Secondary | ICD-10-CM

## 2024-09-30 DIAGNOSIS — E039 Hypothyroidism, unspecified: Secondary | ICD-10-CM

## 2024-09-30 DIAGNOSIS — N320 Bladder-neck obstruction: Secondary | ICD-10-CM

## 2024-09-30 LAB — HEPATIC FUNCTION PANEL
ALT: 24 U/L (ref 0–53)
AST: 25 U/L (ref 0–37)
Albumin: 4.6 g/dL (ref 3.5–5.2)
Alkaline Phosphatase: 53 U/L (ref 39–117)
Bilirubin, Direct: 0.1 mg/dL (ref 0.0–0.3)
Total Bilirubin: 0.6 mg/dL (ref 0.2–1.2)
Total Protein: 6.6 g/dL (ref 6.0–8.3)

## 2024-09-30 LAB — CBC WITH DIFFERENTIAL/PLATELET
Basophils Absolute: 0 K/uL (ref 0.0–0.1)
Basophils Relative: 0.3 % (ref 0.0–3.0)
Eosinophils Absolute: 0.1 K/uL (ref 0.0–0.7)
Eosinophils Relative: 2.8 % (ref 0.0–5.0)
HCT: 45.2 % (ref 39.0–52.0)
Hemoglobin: 15.3 g/dL (ref 13.0–17.0)
Lymphocytes Relative: 23.5 % (ref 12.0–46.0)
Lymphs Abs: 1.3 K/uL (ref 0.7–4.0)
MCHC: 34 g/dL (ref 30.0–36.0)
MCV: 91.7 fl (ref 78.0–100.0)
Monocytes Absolute: 0.6 K/uL (ref 0.1–1.0)
Monocytes Relative: 12 % (ref 3.0–12.0)
Neutro Abs: 3.3 K/uL (ref 1.4–7.7)
Neutrophils Relative %: 61.4 % (ref 43.0–77.0)
Platelets: 173 K/uL (ref 150.0–400.0)
RBC: 4.93 Mil/uL (ref 4.22–5.81)
RDW: 13.2 % (ref 11.5–15.5)
WBC: 5.3 K/uL (ref 4.0–10.5)

## 2024-09-30 LAB — URINALYSIS, ROUTINE W REFLEX MICROSCOPIC
Bilirubin Urine: NEGATIVE
Hgb urine dipstick: NEGATIVE
Ketones, ur: NEGATIVE
Leukocytes,Ua: NEGATIVE
Nitrite: NEGATIVE
RBC / HPF: NONE SEEN (ref 0–?)
Specific Gravity, Urine: 1.025 (ref 1.000–1.030)
Total Protein, Urine: NEGATIVE
Urine Glucose: NEGATIVE
Urobilinogen, UA: 0.2 (ref 0.0–1.0)
pH: 6 (ref 5.0–8.0)

## 2024-09-30 LAB — HEMOGLOBIN A1C: Hgb A1c MFr Bld: 5.9 % (ref 4.6–6.5)

## 2024-09-30 LAB — BASIC METABOLIC PANEL WITH GFR
BUN: 20 mg/dL (ref 6–23)
CO2: 31 meq/L (ref 19–32)
Calcium: 9.5 mg/dL (ref 8.4–10.5)
Chloride: 104 meq/L (ref 96–112)
Creatinine, Ser: 0.83 mg/dL (ref 0.40–1.50)
GFR: 85.09 mL/min (ref >60.00)
Glucose, Bld: 92 mg/dL (ref 70–99)
Potassium: 4.4 meq/L (ref 3.5–5.1)
Sodium: 140 meq/L (ref 135–145)

## 2024-09-30 LAB — LIPID PANEL
Cholesterol: 121 mg/dL (ref 0–200)
HDL: 52.9 mg/dL (ref >39.00)
LDL Cholesterol: 56 mg/dL (ref 0–99)
NonHDL: 67.98
Total CHOL/HDL Ratio: 2
Triglycerides: 61 mg/dL (ref 0.0–149.0)
VLDL: 12.2 mg/dL (ref 0.0–40.0)

## 2024-09-30 LAB — PSA: PSA: 0.6 ng/mL (ref 0.10–4.00)

## 2024-09-30 LAB — TSH: TSH: 4.23 u[IU]/mL (ref 0.35–5.50)

## 2024-09-30 MED ORDER — ROSUVASTATIN CALCIUM 40 MG PO TABS: 40.0000 mg | ORAL_TABLET | Freq: Every day | ORAL | 3 refills | Status: AC

## 2024-09-30 MED ORDER — LEVOTHYROXINE SODIUM 75 MCG PO TABS: 75.0000 ug | ORAL_TABLET | Freq: Every day | ORAL | 3 refills | Status: AC

## 2024-09-30 MED ORDER — SOLIFENACIN SUCCINATE 5 MG PO TABS: 5.0000 mg | ORAL_TABLET | Freq: Every day | ORAL | 3 refills | Status: AC

## 2024-09-30 NOTE — Patient Instructions (Signed)
 Please take all new medication as prescribed - the vesicare  5 mg per day  Please continue all other medications as before, and refills have been done if requested.  Please have the pharmacy call with any other refills you may need.  Please continue your efforts at being more active, low cholesterol diet, and weight control.  You are otherwise up to date with prevention measures today.  Please keep your appointments with your specialists as you may have planned  Please go to the LAB at the blood drawing area for the tests to be done  You will be contacted by phone if any changes need to be made immediately.  Otherwise, you will receive a letter about your results with an explanation, but please check with MyChart first.  Please make an Appointment to return for your 1 year visit, or sooner if needed

## 2024-09-30 NOTE — Progress Notes (Unsigned)
 Patient ID: Kevin Stein, male   DOB: September 24, 1946, 78 y.o.   MRN: [id number]         Chief Complaint:: yearly exam       HPI:  Kevin Stein is a 78 y.o. male here overall doing ok, Pt denies chest pain, increased sob or doe, wheezing, orthopnea, PND, increased LE swelling, palpitations, dizziness or syncope.   Pt denies polydipsia, polyuria, or new focal neuro s/s.    Pt denies fever, wt loss, night sweats, loss of appetite, or other constitutional symptoms   To get hearing aids soon. Wt down about 10 lbs with better diet.  Denies urinary symptoms such as dysuria, urgency, flank pain, hematuria or n/v, fever, chills but has worsening frequency and nocturiz 4 times per night for several months.    Wt Readings from Last 3 Encounters:  09/30/24 213 lb (96.6 kg)  03/11/24 223 lb (101.2 kg)  10/01/23 223 lb (101.2 kg)   BP Readings from Last 3 Encounters:  09/30/24 118/70  10/01/23 130/72  03/10/23 130/72   Immunization History  Administered Date(s) Administered   Fluad Quad(high Dose 65+) 09/05/2023, 08/09/2024   INFLUENZA, HIGH DOSE SEASONAL PF 08/15/2015, 07/26/2018   Influenza,inj,Quad PF,6+ Mos 07/15/2014, 08/26/2016   Influenza-Unspecified 08/09/2017, 07/26/2018, 07/22/2019, 08/06/2022   Moderna Sars-Covid-2 Vaccination 12/03/2019, 12/31/2019, 07/24/2020   PFIZER(Purple Top)SARS-COV-2 Vaccination 08/05/2022   PNEUMOCOCCAL CONJUGATE-20 08/07/2021   Pfizer Covid-19 Vaccine Bivalent Booster 34yrs & up 02/26/2022, 09/05/2023   Pneumococcal Conjugate-13 08/01/2014   Pneumococcal Polysaccharide-23 06/11/2013   Respiratory Syncytial Virus Vaccine,Recomb Aduvanted(Arexvy) 08/12/2022   Td 10/02/2009   Tdap 07/23/2019   Zoster Recombinant(Shingrix) 10/03/2018, 12/22/2018   Zoster, Live 07/06/2008  There are no preventive care reminders to display for this patient.    Past Medical History:  Diagnosis Date   ALLERGIC RHINITIS 07/06/2008   Qualifier: Diagnosis of  By: Norleen MD,  Lynwood ORN    Allergy    BENIGN PROSTATIC HYPERTROPHY 07/06/2008   Qualifier: Diagnosis of  By: Norleen MD, Lynwood ORN    Blood transfusion without reported diagnosis    COLONIC POLYPS, HX OF 07/06/2008   Qualifier: Diagnosis of  By: Norleen MD, Lynwood ORN    DEGENERATIVE JOINT DISEASE, RIGHT SHOULDER 07/06/2008   Qualifier: Diagnosis of  By: Norleen MD, Lynwood ORN    DIVERTICULOSIS, COLON 07/06/2008   Qualifier: Diagnosis of  By: Norleen MD, Lynwood ORN    HYPERLIPIDEMIA 07/06/2008   Qualifier: Diagnosis of  By: Norleen MD, Lynwood ORN    HYPOTHYROIDISM 07/06/2008   Qualifier: Diagnosis of  By: Norleen MD, Lynwood ORN    Peripheral vascular disease    Varices of other sites 12/21/2008   Qualifier: Diagnosis of  By: Norleen MD, Lynwood ORN    Past Surgical History:  Procedure Laterality Date   AXILLARY LYMPH NODE DISSECTION     left     1978    COLONOSCOPY     left middle ear surgury     right shoulder surgury  2010   Dr Kay   TRANSFORAMINAL LUMBAR INTERBODY FUSION (TLIF) WITH PEDICLE SCREW FIXATION 1 LEVEL     VARICOSE VEIN SURGERY     VASECTOMY      reports that he has never smoked. He has never used smokeless tobacco. He reports current alcohol use. He reports that he does not use drugs. family history includes Cancer in his father; Diabetes in his mother; Hypertension in his mother. Allergies[1] Medications Ordered Prior to Encounter[2]  ROS:  All others reviewed and negative.  Objective        PE:  BP 118/70 (BP Location: Right Arm, Patient Position: Sitting, Cuff Size: Normal)   Pulse 65   Temp 98.3 F (36.8 C) (Oral)   Ht 6' (1.829 m)   Wt 213 lb (96.6 kg)   SpO2 99%   BMI 28.89 kg/m                 Constitutional: Pt appears in NAD               HENT: Head: NCAT.                Right Ear: External ear normal.                 Left Ear: External ear normal.                Eyes: . Pupils are equal, round, and reactive to light. Conjunctivae and EOM are normal               Nose: without d/c or  deformity               Neck: Neck supple. Gross normal ROM               Cardiovascular: Normal rate and regular rhythm.                 Pulmonary/Chest: Effort normal and breath sounds without rales or wheezing.                Abd:  Soft, NT, ND, + BS, no organomegaly               Neurological: Pt is alert. At baseline orientation, motor grossly intact               Skin: Skin is warm. No rashes, no other new lesions, LE edema - none               Psychiatric: Pt behavior is normal without agitation   Micro: none  Cardiac tracings I have personally interpreted today:  none  Pertinent Radiological findings (summarize): none   Lab Results  Component Value Date   WBC 5.3 09/30/2024   HGB 15.3 09/30/2024   HCT 45.2 09/30/2024   PLT 173.0 09/30/2024   GLUCOSE 92 09/30/2024   CHOL 121 09/30/2024   TRIG 61.0 09/30/2024   HDL 52.90 09/30/2024   LDLDIRECT 155.6 09/25/2009   LDLCALC 56 09/30/2024   ALT 24 09/30/2024   AST 25 09/30/2024   NA 140 09/30/2024   K 4.4 09/30/2024   CL 104 09/30/2024   CREATININE 0.83 09/30/2024   BUN 20 09/30/2024   CO2 31 09/30/2024   TSH 4.23 09/30/2024   PSA 0.60 09/30/2024   INR 1.0 10/26/2008   HGBA1C 5.9 09/30/2024   Assessment/Plan:  Kevin Stein is a 78 y.o. White or Caucasian [1] male with  has a past medical history of ALLERGIC RHINITIS (07/06/2008), Allergy, BENIGN PROSTATIC HYPERTROPHY (07/06/2008), Blood transfusion without reported diagnosis, COLONIC POLYPS, HX OF (07/06/2008), DEGENERATIVE JOINT DISEASE, RIGHT SHOULDER (07/06/2008), DIVERTICULOSIS, COLON (07/06/2008), HYPERLIPIDEMIA (07/06/2008), HYPOTHYROIDISM (07/06/2008), Peripheral vascular disease, and Varices of other sites (12/21/2008).  Vitamin D  deficiency Last vitamin D  Lab Results  Component Value Date   VD25OH 26.60 (L) 10/01/2023   Low, to start oral replacement   Nocturia more than twice per night C/w likely OAB - for vesicare  5 mg  every day, for UA with  labs  Hypothyroidism Lab Results  Component Value Date   TSH 4.23 09/30/2024   Stable, pt to continue levothyroxine  75 mcg qd   Hyperlipidemia Lab Results  Component Value Date   LDLCALC 56 09/30/2024   Stable, pt to continue current statin crestor  40 mg qd   Hyperglycemia Lab Results  Component Value Date   HGBA1C 5.9 09/30/2024   Stable, pt to continue current medical treatment  - diet, wt control  Followup: Return in about 1 year (around 09/30/2025).  Lynwood Rush, MD 10/02/2024 6:27 PM [facility]     [1]  Allergies Allergen Reactions   Grass Pollen([id number] Vern) Other (See Comments)  [2]  No current outpatient medications on file prior to visit.   No current facility-administered medications on file prior to visit.

## 2024-09-30 NOTE — Progress Notes (Signed)
 The test results show that your current treatment is OK, as the tests are stable.  Please continue the same plan.  There is no other need for change of treatment or further evaluation based on these results, at this time.  thanks

## 2024-10-02 ENCOUNTER — Encounter: Payer: Self-pay | Admitting: Internal Medicine

## 2024-10-02 NOTE — Assessment & Plan Note (Signed)
 Lab Results  Component Value Date   HGBA1C 5.9 09/30/2024   Stable, pt to continue current medical treatment  - diet, wt control

## 2024-10-02 NOTE — Assessment & Plan Note (Signed)
 Lab Results  Component Value Date   LDLCALC 56 09/30/2024   Stable, pt to continue current statin crestor  40 mg qd

## 2024-10-02 NOTE — Assessment & Plan Note (Signed)
 Last vitamin D  Lab Results  Component Value Date   VD25OH 26.60 (L) 10/01/2023   Low, to start oral replacement

## 2024-10-02 NOTE — Assessment & Plan Note (Addendum)
 C/w likely OAB - for vesicare  5 mg every day, for UA with labs

## 2024-10-02 NOTE — Assessment & Plan Note (Signed)
 Lab Results  Component Value Date   TSH 4.23 09/30/2024   Stable, pt to continue levothyroxine  75 mcg qd

## 2025-03-18 ENCOUNTER — Ambulatory Visit
